# Patient Record
Sex: Male | Born: 1935 | Race: White | Hispanic: No | Marital: Single | Smoking: Never smoker
Health system: Southern US, Community
[De-identification: ages and names within clinical notes are randomized; demographics above are authoritative.]

## PROBLEM LIST (undated history)

## (undated) DIAGNOSIS — I10 Essential (primary) hypertension: Secondary | ICD-10-CM

## (undated) DIAGNOSIS — K5730 Diverticulosis of large intestine without perforation or abscess without bleeding: Secondary | ICD-10-CM

## (undated) DIAGNOSIS — Z923 Personal history of irradiation: Secondary | ICD-10-CM

## (undated) DIAGNOSIS — Z973 Presence of spectacles and contact lenses: Secondary | ICD-10-CM

## (undated) DIAGNOSIS — Z9889 Other specified postprocedural states: Secondary | ICD-10-CM

## (undated) DIAGNOSIS — M1990 Unspecified osteoarthritis, unspecified site: Secondary | ICD-10-CM

## (undated) DIAGNOSIS — N400 Enlarged prostate without lower urinary tract symptoms: Secondary | ICD-10-CM

## (undated) DIAGNOSIS — G14 Postpolio syndrome: Secondary | ICD-10-CM

## (undated) DIAGNOSIS — N529 Male erectile dysfunction, unspecified: Secondary | ICD-10-CM

## (undated) DIAGNOSIS — Z85828 Personal history of other malignant neoplasm of skin: Secondary | ICD-10-CM

## (undated) DIAGNOSIS — Z87442 Personal history of urinary calculi: Secondary | ICD-10-CM

## (undated) DIAGNOSIS — N200 Calculus of kidney: Secondary | ICD-10-CM

## (undated) DIAGNOSIS — Z974 Presence of external hearing-aid: Secondary | ICD-10-CM

## (undated) DIAGNOSIS — E785 Hyperlipidemia, unspecified: Secondary | ICD-10-CM

## (undated) DIAGNOSIS — C61 Malignant neoplasm of prostate: Secondary | ICD-10-CM

## (undated) DIAGNOSIS — N401 Enlarged prostate with lower urinary tract symptoms: Secondary | ICD-10-CM

## (undated) DIAGNOSIS — R29898 Other symptoms and signs involving the musculoskeletal system: Secondary | ICD-10-CM

## (undated) DIAGNOSIS — Z86010 Personal history of colonic polyps: Secondary | ICD-10-CM

## (undated) DIAGNOSIS — A809 Acute poliomyelitis, unspecified: Secondary | ICD-10-CM

## (undated) DIAGNOSIS — Z860101 Personal history of adenomatous and serrated colon polyps: Secondary | ICD-10-CM

## (undated) HISTORY — DX: Acute poliomyelitis, unspecified: A80.9

## (undated) HISTORY — DX: Malignant neoplasm of prostate: C61

## (undated) HISTORY — DX: Other specified postprocedural states: Z98.890

## (undated) HISTORY — DX: Essential (primary) hypertension: I10

## (undated) HISTORY — DX: Calculus of kidney: N20.0

## (undated) HISTORY — DX: Benign prostatic hyperplasia with lower urinary tract symptoms: N40.1

## (undated) HISTORY — PX: APPENDECTOMY: SHX54

## (undated) HISTORY — DX: Personal history of colonic polyps: Z86.010

---
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
## 1989-07-09 DIAGNOSIS — N200 Calculus of kidney: Secondary | ICD-10-CM

## 1989-07-09 HISTORY — DX: Calculus of kidney: N20.0

## 1990-11-08 HISTORY — PX: CYSTOSCOPY/RETROGRADE/URETEROSCOPY/STONE EXTRACTION WITH BASKET: SHX5317

## 2008-11-08 HISTORY — PX: FEMUR FRACTURE SURGERY: SHX633

## 2009-03-11 ENCOUNTER — Inpatient Hospital Stay (HOSPITAL_COMMUNITY): Admission: EM | Admit: 2009-03-11 | Discharge: 2009-03-15 | Payer: Self-pay | Admitting: Emergency Medicine

## 2009-03-11 HISTORY — PX: ORIF DISTAL FEMUR FRACTURE: SUR926

## 2009-08-13 ENCOUNTER — Encounter: Admission: RE | Admit: 2009-08-13 | Discharge: 2009-08-20 | Payer: Self-pay | Admitting: Internal Medicine

## 2010-06-08 DIAGNOSIS — Z8546 Personal history of malignant neoplasm of prostate: Secondary | ICD-10-CM

## 2010-06-08 HISTORY — DX: Personal history of malignant neoplasm of prostate: Z85.46

## 2010-06-10 ENCOUNTER — Ambulatory Visit (HOSPITAL_COMMUNITY): Admission: RE | Admit: 2010-06-10 | Discharge: 2010-06-10 | Payer: Self-pay | Admitting: Urology

## 2010-06-16 ENCOUNTER — Ambulatory Visit: Admission: RE | Admit: 2010-06-16 | Discharge: 2010-07-29 | Payer: Self-pay | Admitting: Radiation Oncology

## 2010-09-07 ENCOUNTER — Ambulatory Visit
Admission: RE | Admit: 2010-09-07 | Discharge: 2010-10-21 | Payer: Self-pay | Source: Home / Self Care | Attending: Radiation Oncology | Admitting: Radiation Oncology

## 2010-11-05 ENCOUNTER — Encounter
Admission: RE | Admit: 2010-11-05 | Discharge: 2010-11-05 | Payer: Self-pay | Source: Home / Self Care | Attending: Urology | Admitting: Urology

## 2010-11-12 ENCOUNTER — Ambulatory Visit
Admission: RE | Admit: 2010-11-12 | Discharge: 2010-11-12 | Payer: Self-pay | Source: Home / Self Care | Attending: Urology | Admitting: Urology

## 2010-11-12 HISTORY — PX: RADIOACTIVE SEED IMPLANT: SHX5150

## 2010-12-03 ENCOUNTER — Ambulatory Visit
Admission: RE | Admit: 2010-12-03 | Discharge: 2010-12-08 | Payer: Self-pay | Source: Home / Self Care | Attending: Radiation Oncology | Admitting: Radiation Oncology

## 2010-12-21 ENCOUNTER — Ambulatory Visit: Payer: MEDICARE | Attending: Radiation Oncology | Admitting: Radiation Oncology

## 2010-12-21 DIAGNOSIS — C61 Malignant neoplasm of prostate: Secondary | ICD-10-CM | POA: Insufficient documentation

## 2011-01-18 LAB — CBC
HCT: 39.4 % (ref 39.0–52.0)
Hemoglobin: 12.8 g/dL — ABNORMAL LOW (ref 13.0–17.0)
MCH: 31.1 pg (ref 26.0–34.0)
MCHC: 32.5 g/dL (ref 30.0–36.0)
MCV: 95.6 fL (ref 78.0–100.0)
RBC: 4.12 MIL/uL — ABNORMAL LOW (ref 4.22–5.81)
WBC: 5.6 10*3/uL (ref 4.0–10.5)

## 2011-01-18 LAB — COMPREHENSIVE METABOLIC PANEL
ALT: 25 U/L (ref 0–53)
AST: 26 U/L (ref 0–37)
Albumin: 3.6 g/dL (ref 3.5–5.2)
Alkaline Phosphatase: 83 U/L (ref 39–117)
Glucose, Bld: 117 mg/dL — ABNORMAL HIGH (ref 70–99)
Total Bilirubin: 0.7 mg/dL (ref 0.3–1.2)
Total Protein: 6.4 g/dL (ref 6.0–8.3)

## 2011-01-18 LAB — PROTIME-INR
INR: 0.96 (ref 0.00–1.49)
Prothrombin Time: 13 seconds (ref 11.6–15.2)

## 2011-02-16 LAB — ABO/RH: ABO/RH(D): O POS

## 2011-02-16 LAB — CBC
HCT: 41 % (ref 39.0–52.0)
Hemoglobin: 11 g/dL — ABNORMAL LOW (ref 13.0–17.0)
Hemoglobin: 8.8 g/dL — ABNORMAL LOW (ref 13.0–17.0)
MCHC: 34.3 g/dL (ref 30.0–36.0)
MCHC: 34.4 g/dL (ref 30.0–36.0)
Platelets: 143 10*3/uL — ABNORMAL LOW (ref 150–400)
RBC: 2.78 MIL/uL — ABNORMAL LOW (ref 4.22–5.81)
RBC: 3.51 MIL/uL — ABNORMAL LOW (ref 4.22–5.81)
RBC: 4.5 MIL/uL (ref 4.22–5.81)
RDW: 13.9 % (ref 11.5–15.5)
RDW: 14 % (ref 11.5–15.5)
WBC: 8.2 10*3/uL (ref 4.0–10.5)

## 2011-02-16 LAB — BASIC METABOLIC PANEL
BUN: 12 mg/dL (ref 6–23)
CO2: 27 mEq/L (ref 19–32)
Chloride: 107 mEq/L (ref 96–112)
Creatinine, Ser: 0.82 mg/dL (ref 0.4–1.5)
Creatinine, Ser: 1.1 mg/dL (ref 0.4–1.5)
GFR calc non Af Amer: >60 mL/min (ref >60)
GFR calc non Af Amer: >60 mL/min (ref >60)
Glucose, Bld: 116 mg/dL — ABNORMAL HIGH (ref 70–99)
Potassium: 3.5 mEq/L (ref 3.5–5.1)
Potassium: 4.5 mEq/L (ref 3.5–5.1)

## 2011-02-16 LAB — POCT I-STAT, CHEM 8
BUN: 19 mg/dL (ref 6–23)
Chloride: 106 mEq/L (ref 96–112)
Glucose, Bld: 112 mg/dL — ABNORMAL HIGH (ref 70–99)
HCT: 43 % (ref 39.0–52.0)
Sodium: 141 mEq/L (ref 135–145)

## 2011-02-16 LAB — TYPE AND SCREEN: ABO/RH(D): O POS

## 2011-02-16 LAB — HEMOGLOBIN AND HEMATOCRIT, BLOOD
HCT: 24.3 % — ABNORMAL LOW (ref 39.0–52.0)
HCT: 29 % — ABNORMAL LOW (ref 39.0–52.0)

## 2011-02-16 LAB — DIFFERENTIAL
Basophils Absolute: 0 10*3/uL (ref 0.0–0.1)
Eosinophils Relative: 0 % (ref 0–5)
Lymphocytes Relative: 6 % — ABNORMAL LOW (ref 12–46)
Lymphs Abs: 0.9 10*3/uL (ref 0.7–4.0)
Neutro Abs: 12.9 10*3/uL — ABNORMAL HIGH (ref 1.7–7.7)

## 2011-03-23 NOTE — Discharge Summary (Signed)
NAMEGERRITT, GALENTINE NO.:  [id number]   MEDICAL RECORD NO.:  [id number]          PATIENT TYPE:  INP   LOCATION:  1612                         FACILITY:  [facility]   PHYSICIAN:  Madlyn Frankel. Charlann Boxer, M.D.  DATE OF BIRTH:  [birth year]   DATE OF ADMISSION:  03/10/2009  DATE OF DISCHARGE:                               DISCHARGE SUMMARY   DATE OF DISCHARGE:  Will be Mar 15, 2009.   ADMITTING DIAGNOSES:  1. Right femur fracture.  2. Post polio.  3. Hypertension.   DISCHARGE DIAGNOSES:  1. Right femur fracture.  2. Post polio.  3. Hypertension.  4. Acute blood loss anemia.   HISTORY OF PRESENT ILLNESS:  Mr. Oddo is a very pleasant, healthy 77-  year-old gentleman who fell at his kitchen at home, twisting, falling on  his right side.  X-rays at [facility] Emergency Department revealed a  right mid shaft humeral fracture spiral in nature.  He was admitted in  the hospital for definitive surgical management.  Prior to this injury  he did have muscle atrophy of this right side as well as some mild  contracture of this right lower extremity.   CONSULTS:  None.   PROCEDURE:  Was an open reduction internal fixation of his right distal  femur with spiral fracture with nailing as well as cable use.  Surgeon  was Dr. Durene Romans, assistant was [facility] PA-C.   LABORATORY DATA:  CBC:  Hemoglobin/hematocrit checked.  His final  reading white blood cell count was 8.2.  Hemoglobin/hematocrit checked  on the 7th was 8.3 and 24.3.  His platelets were 143.  Metabolic panel:  Sodium 139, potassium 3.6, BUN 12, creatinine was 1.1 with increase from  prior reading, and glucose was 116.   RADIOLOGY:  Right femur intraoperatively showed intramedullary rod right  femur with distal interlocking cortical screw with 3 cerclage wires.   Cardiology showed normal sinus rhythm.   HOSPITAL COURSE:  The patient admitted to orthopedic service.  He  underwent surgery on the 4th.  During his  course of stay in the  hospital, he did have some acute blood loss anemia.  Was transfused 2  units on the 7th.  He was touchdown weightbearing to this right lower  extremity with knee immobilizer, and predominantly had physical therapy  as well as occupational therapy to help with transfers as well as  encouragement with independence with activities of daily living.  He was  agreeable to skilled nursing facility placement, and this was  coordinated through case management and family.  [facility] was  selected.  When seen on the 7th he was receiving a transfusion with plan  of recheck of H and H in the morning and discharge to skilled nursing  facility on the 8th.  He had several incisions on the lateral aspect of  his right lower extremity which were closed with staples, and covered  with dry gauze dressing with no significant purulent drainage from any  wound site.  There was some mild bleeding.  He does have some swelling  of that right knee and movement  was painful.   DISCHARGE DISPOSITION:  Discharged to skilled nursing facility  rehabilitation, stable and improved condition.   DISCHARGE DIET:  Heart-healthy.   DISCHARGE WOUND CARE:  Keep dry.   DISCHARGE PHYSICAL THERAPY:  He is touchdown weightbearing of this right  lower extremity 25-50% predominantly for transfers.  Goals of physical  therapy will be to maintain upper body strength, allow for transfers,  encourage independence in activities of daily living.  Prior to injury  he did have muscle atrophy and weakness with contracture of this right  lower extremity in the past.   DISCHARGE MEDICATIONS:  1. Lovenox 40 mg subcu q.24 x10 days.  2. Start enteric-coated aspirin 325 mg 1 p.o. daily x4 weeks after      Lovenox is completed.  3. Robaxin 500 mg 1 p.o. q.6 p.r.n. muscle spasm pain.  4. Norco 7.5/325 one to two p.o. q.4-6 p.r.n. pain.  5. Ativan 0.5 mg 1 p.o. q.8 p.r.n. anxiety, pain.  6. Colace 100 mg 1 p.o. b.i.d.  p.r.n. constipation.  7. MiraLax 17 g 1 p.o. daily p.r.n. constipation.  8. Tylenol 325-650 mg p.o. q.4-6 p.r.n. mild pain in substitution      rather than administering Norco preferred by patient.  9. Hyzaar 100/25 one p.o. daily.  10.Multivitamin p.o. daily.  11.Omega-3 1000 mg 1 with each meal 3 times a day.  12.Vitamin B12.  He takes two 2500 mcg daily.   DISCHARGE FOLLOWUP:  Follow up with Dr. Charlann Boxer at phone number [phone] in  2 weeks for a wound check.     ______________________________  Yetta Glassman. [address], [place]      Madlyn Frankel. Charlann Boxer, M.D.  Electronically Signed    BLM/MEDQ  D:  03/14/2009  T:  03/14/2009  Job:  [id number]

## 2011-03-23 NOTE — Op Note (Signed)
NAMESILVINO, SELMAN NO.:  [id number]   MEDICAL RECORD NO.:  [id number]          PATIENT TYPE:  INP   LOCATION:  1612                         FACILITY:  [facility]   PHYSICIAN:  Madlyn Frankel. Charlann Boxer, M.D.  DATE OF BIRTH:  28-Sep-1932   DATE OF PROCEDURE:  03/11/2009  DATE OF DISCHARGE:                               OPERATIVE REPORT   PREOPERATIVE DIAGNOSIS:  Distal one-third right spiral femur fracture.   POSTOPERATIVE DIAGNOSIS:  Distal one-third right spiral femur fracture.   PROCEDURE:  Open reduction and internal fixation of right distal femur  fracture utilizing a DePuy universal troch entry nail measuring 42 cm x  13 mm in diameter with a single greater troch, the lesser troch screw  proximally, and a single distal interlock distally in 3 cables around  the spiral comminuted butterfly fragment section.   These were by Thallium Dall-Miles cables.   SURGEON:  Madlyn Frankel. Charlann Boxer, MD.   ASSISTANTYetta Glassman. Loreta Ave, PA-C.   ANESTHESIA:  General.   BLOOD LOSS:  600 mL.   COMPLICATIONS:  None.   SPECIMEN:  None.   INDICATIONS FOR PROCEDURE IN DETAIL:  This is a 77 year old male, who  was in his kitchen cooking dinner when he slipped and fell.  He was  brought by EMS to the emergency room where radiographs revealed a distal  one-third spiral femur fracture.  He was admitted to the hospital in the  late evening of Mar 10, 2009, into Mar 11, 2009, with plans to operate  that day.  The risks and benefits of the operation were discussed,  consent was obtained, for benefit of fracture healing and improved  mobility.   PROCEDURE IN DETAIL:  The patient was brought to the operative theater.  Once adequate anesthesia, preoperative antibiotics, Ancef administered,  the patient was positioned on the fracture table.  His left leg was  flexed and abducted out of the way with bony prominences padded.  The  right foot was placed into the traction shoe.  Under fluoroscopic  imaging, I applied enough traction to get the fracture out to a normal  length.  Once the length was restored, we prepped and draped the hip  from the iliac crest down to below the knee.   A preoperative timeout was performed identifying the patient, planned  procedure, and the extremity as well as other factors.   Fluoroscopic imaging was brought back to the field to identify  landmarks, and a proximal incision was made to the trochanter.  Sharp  dissection was carried through the gluteal fascia, and the guidewire  inserted into the tip of the trochanter.   A starting reamer was used to open up the proximal femur followed by the  placement of a cannulated awl to pass the ball-tip guidewire.  The ball-  tip guidewire was then passed across the fracture site and confirmed in  the AP and lateral plane with fracture maneuvers identified that would  help to reduce the fracture including basically a load from the  posterolateral area of the fracture site directed into an anteromedial  direction.  This was noted from the later reduction.  With the guidewire, I  subsequently began reaming with a 10-mm reamer and reamed all the way up  to 14.5 to pass a 13-mm nail.  I measured the depth of the nail to be 42  cm.   When I began to pass the final nail, I ended up removing the nail after  trying to get it about a third of the way and change it out and reamed  up to 15 mm to prevent any complication.   The nail was then passed by hand basically across the fracture site and  then tapped into its correct orientation with enlarged anteversion with  the femoral neck and shaft.  The fracture remained reduced nicely in the  AP plane.  In the lateral plane, there was still displacement of the  butterfly fragment into spiral sections.  In addition, what I did find  is that it still required a significant portion of this posterolateral  aspect in order to maintain the reduced fracture.   At this  point, I placed a greater troch to lesser troch screw.  I did  this based on the fact that he tended to have a valgus femoral neck  shaft angle making it difficult to get a screw into the correct  orientation with the nail and the correct placement for the distal  portion.  This screw was placed without difficulty.   Distally under perfect circle technique, I placed a single; the system  did not have a second screw available that measured greater than 85 mm  to pass into the most distal metaphyseal region of the femur for a  second bite that would be necessary.   Here, I had to make a decision on whether or not to close up the hip at  this point in the femur and proceed with a procedure at a later date or  to come up with an alternative.  I was not happy with the deforming  forces that were still around the fracture site, even on the fracture  table with some tension, he still had a significant varus load through  his fracture site.  I had to hold it into a correct orientation to keep  the fracture reduced.  Based on these findings, I made a decision at  this point to open up at the fracture site.  Sharp dissection was  carried down through the iliotibial band and then I elevated the vastus  lateralis musculature over the fracture site.  With the  fracture site  identified, I placed the 3 Vitallium cables under fluoroscopic guidance.  What this did was it  further reduced the butterfly fragment and spiral  segments around the intramedullary nail into an anatomic reduction.  No  longer was there any force required to maintain a reduced fracture.  I  think this did 2 things.  Number 1, it reduced the fracture into an  anatomic position allowing for better healing with a more rigid  fixation, but it subsequently took the tension off this distal  interlock.  At this point, I felt secure at least not having a second  interlock and I did not feel that I would need to bring him back in the   operating room provided I was able to keep him restricted in his  weightbearing.  Following this, the decision-making process and the  conclusion of the procedure, the wounds were reirrigated both proximally  and distally.  The gluteal  fascia proximally was reapproximated using #1  Vicryl, the iliotibial band was reapproximated to the vastus lateralis  using #1 Vicryl.  All the wounds were then subsequently closed with 2-0  Vicryl and staples on the skin.   Following this, the hip was clean, dry, and dressed sterilely.  I placed  him into a knee immobilizer on the right knee to further help reduce  some of the tension on this knee and distal femur.  He was brought to  the recovery room extubated in stable condition.      Madlyn Frankel Charlann Boxer, M.D.  Electronically Signed     MDO/MEDQ  D:  03/11/2009  T:  03/11/2009  Job:  [id number]

## 2011-03-23 NOTE — H&P (Signed)
NAMEETHEN, BANNAN NO.:  [id number]   MEDICAL RECORD NO.:  [id number]          PATIENT TYPE:  INP   LOCATION:                               FACILITY:  [facility]   PHYSICIAN:  Madlyn Frankel. Charlann Boxer, M.D.  DATE OF BIRTH:  1932-05-15   DATE OF ADMISSION:  03/10/2009  DATE OF DISCHARGE:                              HISTORY & PHYSICAL   CHIEF COMPLAINT:  Pain in the right thigh.   PRESENT ILLNESS:  This 77 year old white male was at his home cooking in  his kitchen when he spilled some grease on to the floor.  He slipped on  this grease and fell on his right side.  His leg somewhat twisted as he  fell.  He has a history of polio, which he contracted in 18 in [place]  and has done fairly well throughout the years but he does have weakness  in the lower extremities, more so on the right than the left.  He had  immediate pain into the right thigh.  Ambulance was called and he was  brought to [facility] Emergency Room.  He did not suffer any other  injuries, just his right lower extremity.  X-rays have shown a spiral  minimally displaced midshaft fracture of the right femur.  Dr. Charlann Boxer  plans to do an intramedullary nail when operating room schedule permits  tomorrow.   Examination of the right lower extremity reveals some swelling about the  thigh.  He does have wasting of both lower extremities.  Sensory is  grossly intact to the right foot and pulses are present but faint on the  dorsalis pedis.  He can move his ankle without difficulty.   PAST MEDICAL HISTORY:  This gentleman is in relatively good health  throughout his lifetime.   MEDICATIONS:  Aspirin 81 mg daily and Hyzaar.   SOCIAL HISTORY:  The patient has an occasional intake of EtOH and no  tobacco products.   ALLERGIES:  He has no medical allergies.   FAMILY HISTORY:  Noncontributory.   REVIEW OF SYSTEMS:  CNS:  No seizure disorder, paralysis, numbness or  double vision, but he does have wasting of lower  extremities, again  secondary to polio.  CARDIOVASCULAR:  No chest pain, no angina, and no orthopnea.  RESPIRATORY:  No productive cough.  No hemoptysis.  No shortness of  breath.  GASTROINTESTINAL:  No nausea, vomiting, melena, or bloody stool.  GENITOURINARY:  No discharge, dysuria, or hematuria.  MUSCULOSKELETAL:  Other than present illness with pain in the right  thigh, he has fairly good ambulation and occasionally will use a cane.   PHYSICAL EXAMINATION:  GENERAL:  Alert and cooperative 77 year old white  male seen lying on emergency room stretcher.  He is can accompanied by  his son, Lorin Picket.  VITAL SIGNS:  Blood pressure 134/73, pulse 82, respirations 18, and  temperature 97.5.  HEENT: Normocephalic.  PERRLA.  EOM Intact.  Oropharynx is clear.  CHEST:  Clear to auscultation.  No rhonchi, no rales, and no wheezes  with the patient auscultated supine.  HEART:  Regular rate and rhythm.  No murmurs are heard.  ABDOMEN:  Soft and nontender.  Liver and spleen not felt.  GENITALIA and RECTAL:  Not done, not pertinent to present illness.  EXTREMITIES:  Right lower extremity as in present illness above.   ADMISSION DIAGNOSES:  1. Spiral fracture of the midshaft of the right femur.  2. Hypertension.   PLAN:  The patient will undergo intramedullary nailing of the right  femur tomorrow when operating room schedule permits.  Dr. Charlann Boxer was in  attendance during this patient's examination and admission.      Dooley L. Cherlynn June.      Madlyn Frankel Charlann Boxer, M.D.  Electronically Signed    DLU/MEDQ  D:  03/10/2009  T:  03/11/2009  Job:  [id number]   cc:   Madlyn Frankel. Charlann Boxer, M.D.  Fax: [phone]

## 2011-09-10 IMAGING — CR DG PELVIS 1-2V
1 series · 1 of 1 positions shown · non-contrast
Comparison: 03/10/2009.

CLINICAL DATA: Prostate cancer.

PELVIS - 1-2 VIEW

[t pelvis a.p.]
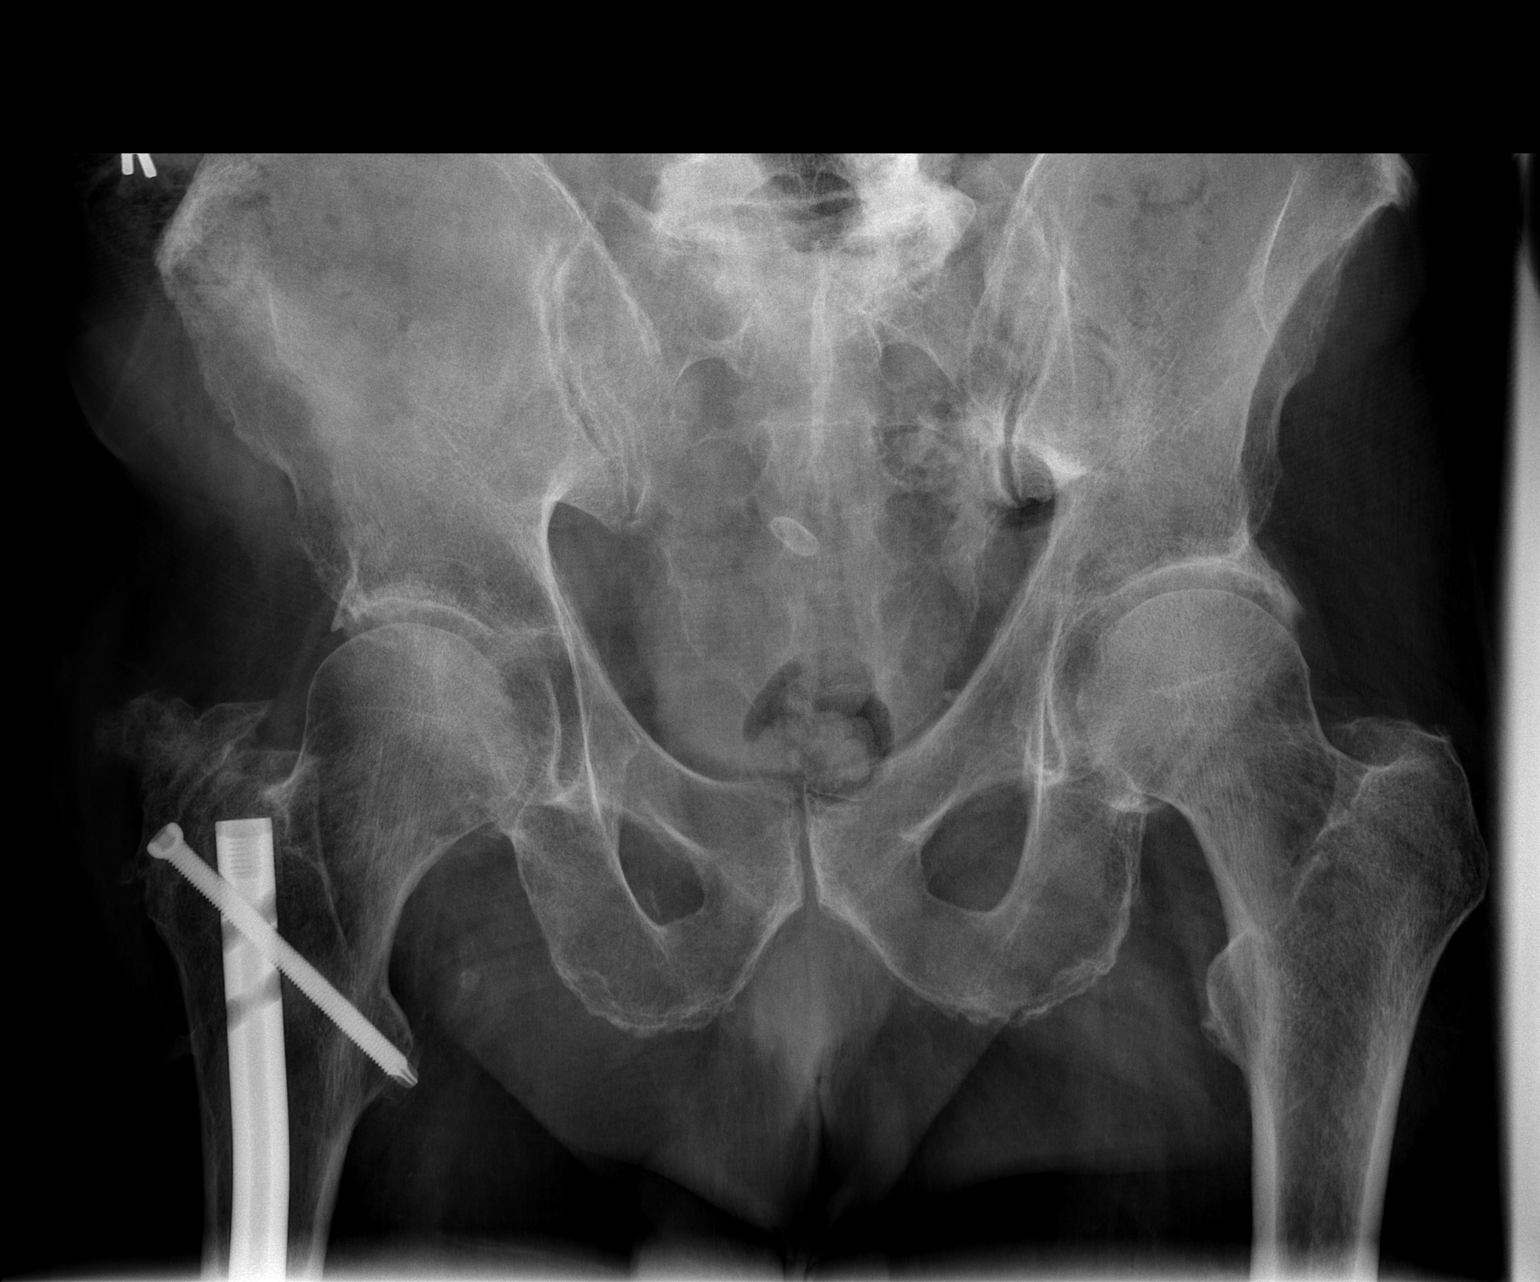

[1 of 1 positions shown; findings below may reference images not displayed]

FINDINGS: There is a intermedullary rod in the right femur.  Both
hips are normally located.  No lytic or blastic lesions are seen.
The pubic symphysis SI joints are intact.  There are mild symmetric
hip joint generative changes bilaterally.
IMPRESSION: No plain film findings for bony metastatic disease involving the
pelvis.

## 2011-10-19 ENCOUNTER — Encounter: Payer: Self-pay | Admitting: *Deleted

## 2011-10-19 NOTE — Progress Notes (Signed)
06/17/10 IPPIS  Results= 0010000 score=1, 06/17/10 IIEF results= [id number] 12/04/2010 IPPIS results= [id number]

## 2012-02-05 IMAGING — CR DG CHEST 2V
2 series · 2 of 2 positions shown · non-contrast
Comparison: 03/10/2009.

CLINICAL DATA: Prostate cancer.  Preoperative chest x-ray.

CHEST - 2 VIEW

[w chest pa]
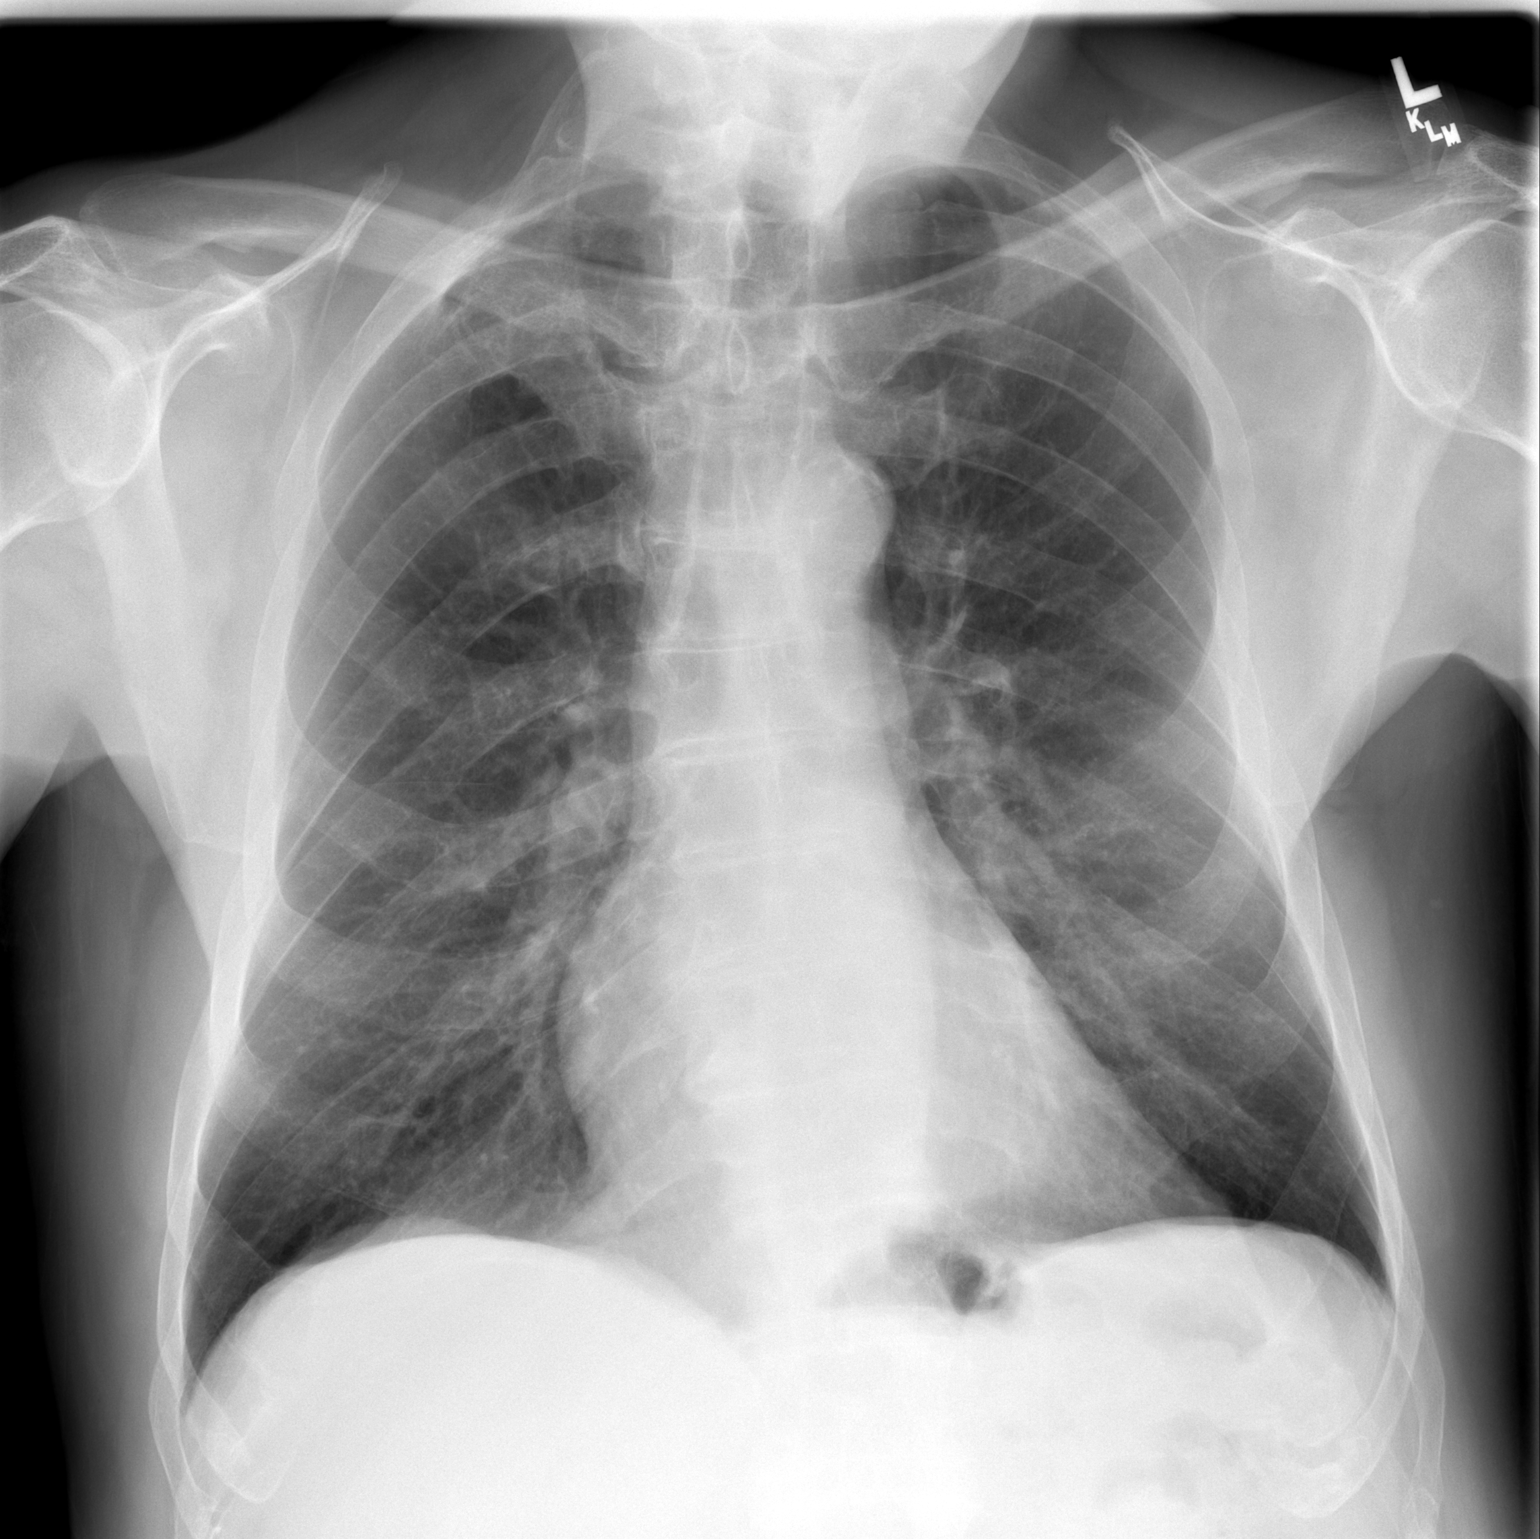

[w chest lat]
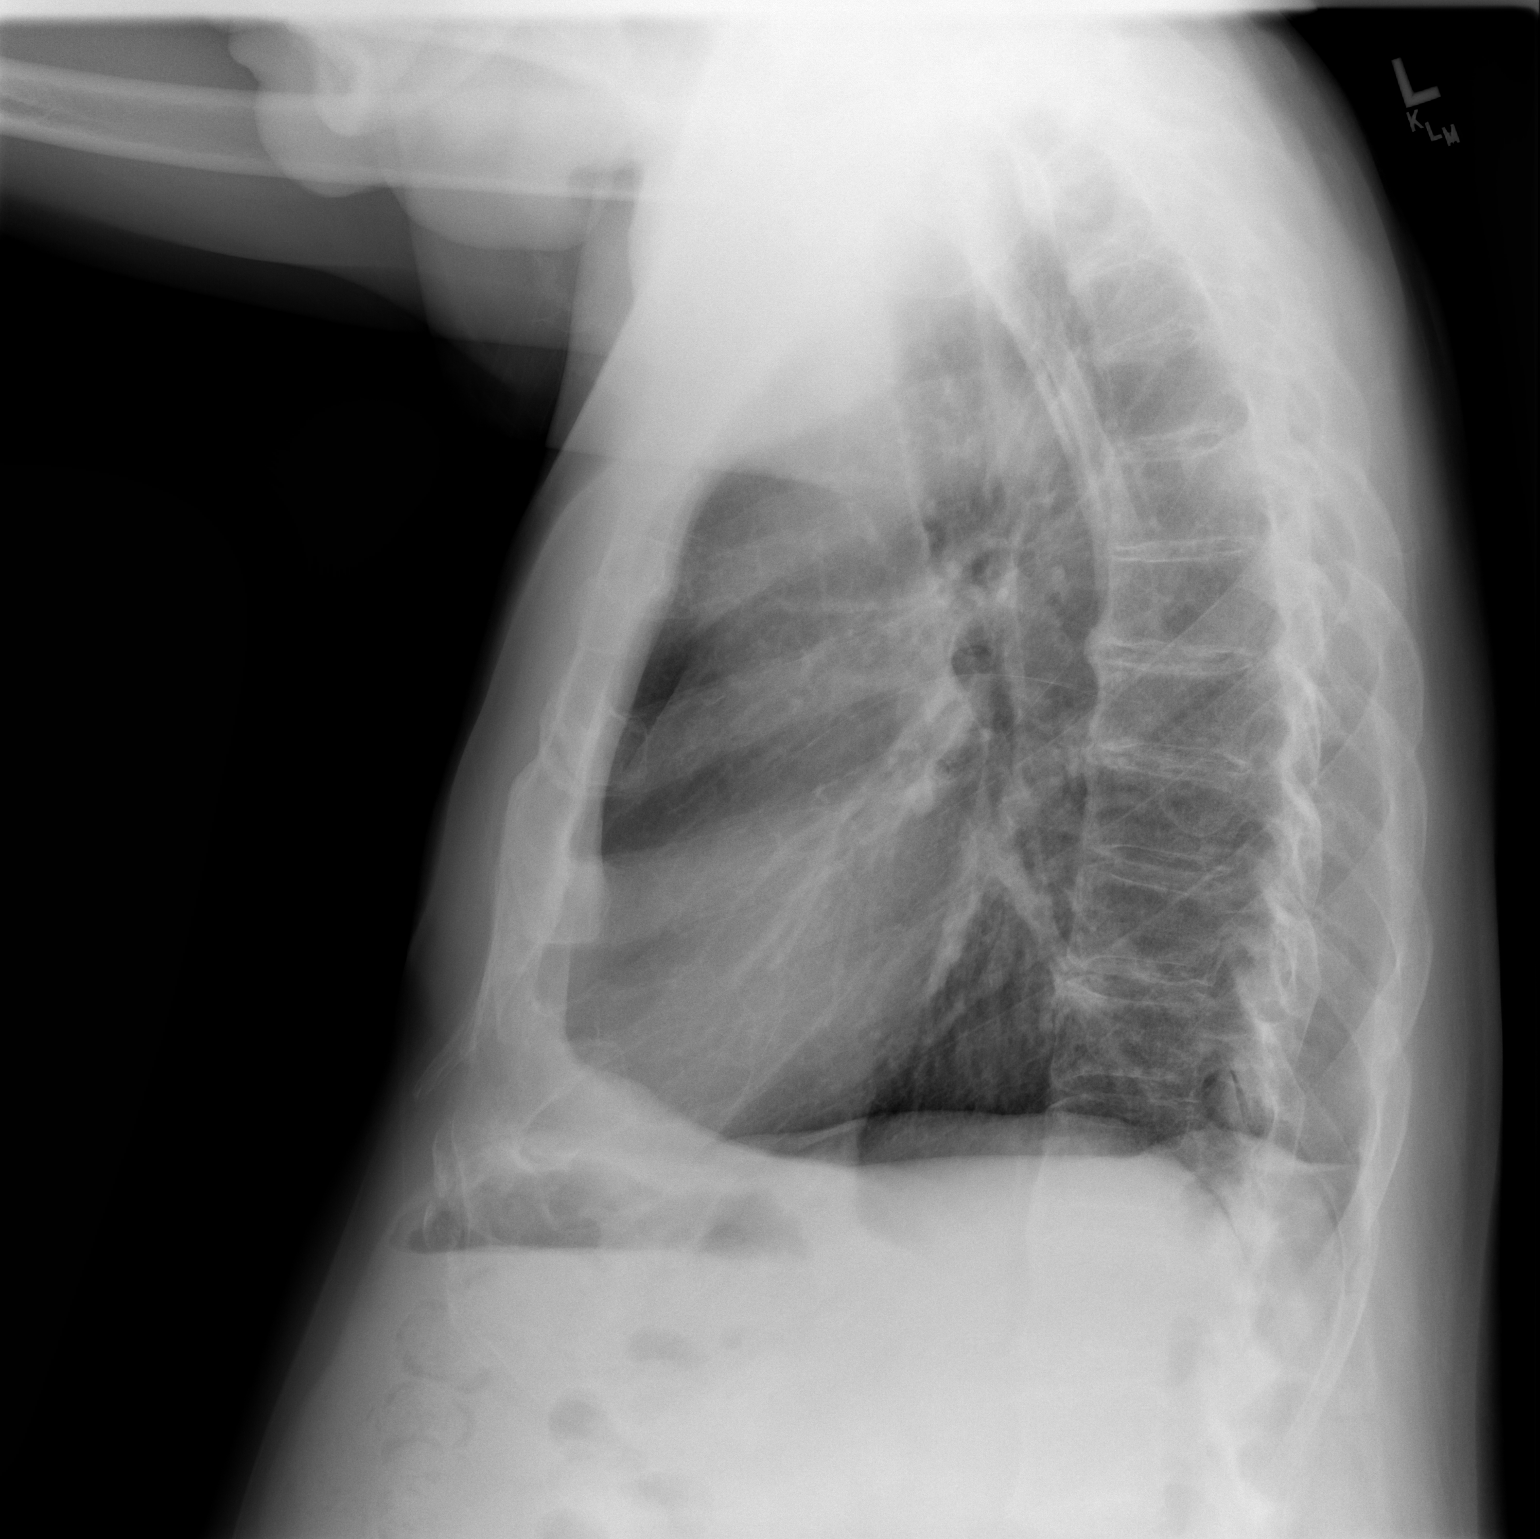

[2 of 2 positions shown; findings below may reference images not displayed]

FINDINGS: The lungs are clear.  The heart and mediastinal
structures are normal.  There is mild hyperinflation.
IMPRESSION: Mild hyperinflation.  No evidence for active chest disease.

## 2013-04-12 ENCOUNTER — Encounter: Payer: Self-pay | Admitting: Internal Medicine

## 2013-05-24 ENCOUNTER — Ambulatory Visit (AMBULATORY_SURGERY_CENTER): Payer: Medicare Other | Admitting: *Deleted

## 2013-05-24 ENCOUNTER — Encounter: Payer: Self-pay | Admitting: Internal Medicine

## 2013-05-24 VITALS — Ht 72.0 in | Wt 207.0 lb

## 2013-05-24 DIAGNOSIS — K921 Melena: Secondary | ICD-10-CM

## 2013-05-24 MED ORDER — MOVIPREP 100 G PO SOLR: 1.0000 | Freq: Once | ORAL | Status: DC

## 2013-05-24 NOTE — Progress Notes (Signed)
Denies allergies to eggs or soy products. Denies complications with sedation or anesthesia. 

## 2013-06-07 ENCOUNTER — Encounter: Payer: Self-pay | Admitting: Internal Medicine

## 2013-06-07 ENCOUNTER — Ambulatory Visit (AMBULATORY_SURGERY_CENTER): Payer: Medicare Other | Admitting: Internal Medicine

## 2013-06-07 VITALS — BP 143/90 | HR 71 | Temp 96.5°F | Resp 62 | Ht 72.0 in | Wt 207.0 lb

## 2013-06-07 DIAGNOSIS — D126 Benign neoplasm of colon, unspecified: Secondary | ICD-10-CM

## 2013-06-07 DIAGNOSIS — K627 Radiation proctitis: Secondary | ICD-10-CM

## 2013-06-07 DIAGNOSIS — K921 Melena: Secondary | ICD-10-CM

## 2013-06-07 DIAGNOSIS — K6289 Other specified diseases of anus and rectum: Secondary | ICD-10-CM

## 2013-06-07 HISTORY — PX: COLONOSCOPY WITH PROPOFOL: SHX5780

## 2013-06-07 MED ORDER — SODIUM CHLORIDE 0.9 % IV SOLN: 500.0000 mL | INTRAVENOUS | Status: DC

## 2013-06-07 NOTE — Progress Notes (Signed)
No complaints noted in the recovery room. Maw  I assisted the pt with dressing, and sent his daughter to get the car.  Pt's daughter has his glasses.  maw

## 2013-06-07 NOTE — Patient Instructions (Addendum)
YOU HAD AN ENDOSCOPIC PROCEDURE TODAY AT THE [facility]: Refer to the procedure report that was given to you for any specific questions about what was found during the examination.  If the procedure report does not answer your questions, please call your gastroenterologist to clarify.  If you requested that your care partner not be given the details of your procedure findings, then the procedure report has been included in a sealed envelope for you to review at your convenience later.  YOU SHOULD EXPECT: Some feelings of bloating in the abdomen. Passage of more gas than usual.  Walking can help get rid of the air that was put into your GI tract during the procedure and reduce the bloating. If you had a lower endoscopy (such as a colonoscopy or flexible sigmoidoscopy) you may notice spotting of blood in your stool or on the toilet paper. If you underwent a bowel prep for your procedure, then you may not have a normal bowel movement for a few days.  DIET: Your first meal following the procedure should be a light meal and then it is ok to progress to your normal diet.  A half-sandwich or bowl of soup is an example of a good first meal.  Heavy or fried foods are harder to digest and may make you feel nauseous or bloated.  Likewise meals heavy in dairy and vegetables can cause extra gas to form and this can also increase the bloating.  Drink plenty of fluids but you should avoid alcoholic beverages for 24 hours.  ACTIVITY: Your care partner should take you home directly after the procedure.  You should plan to take it easy, moving slowly for the rest of the day.  You can resume normal activity the day after the procedure however you should NOT DRIVE or use heavy machinery for 24 hours (because of the sedation medicines used during the test).    SYMPTOMS TO REPORT IMMEDIATELY: A gastroenterologist can be reached at any hour.  During normal business hours, 8:30 AM to 5:00 PM Monday through Friday,  call [phone].  After hours and on weekends, please call the GI answering service at [phone] who will take a message and have the physician on call contact you.   Following lower endoscopy (colonoscopy or flexible sigmoidoscopy):  Excessive amounts of blood in the stool  Significant tenderness or worsening of abdominal pains  Swelling of the abdomen that is new, acute  Fever of 100F or higher  FOLLOW UP: If any biopsies were taken you will be contacted by phone or by letter within the next 1-3 weeks.  Call your gastroenterologist if you have not heard about the biopsies in 3 weeks.  Our staff will call the home number listed on your records the next business day following your procedure to check on you and address any questions or concerns that you may have at that time regarding the information given to you following your procedure. This is a courtesy call and so if there is no answer at the home number and we have not heard from you through the emergency physician on call, we will assume that you have returned to your regular daily activities without incident.  SIGNATURES/CONFIDENTIALITY: You and/or your care partner have signed paperwork which will be entered into your electronic medical record.  These signatures attest to the fact that that the information above on your After Visit Summary has been reviewed and is understood.  Full responsibility of the confidentiality of this   discharge information lies with you and/or your care-partner.   Handouts were given to your care partner on polyps, diverticulosis, high fiber diet and proctitis. Resume your current medications today. Per Dr. Marina Goodell he does not recommend routine hemoccult studies in the future or follow up colonoscopy. Return to the care of your primary provider. Please call if any questions or concerns.

## 2013-06-07 NOTE — Op Note (Signed)
[facility] [address] [place], [address]   COLONOSCOPY PROCEDURE REPORT  PATIENT: Nathaniel, Watson  MR#: [id number] BIRTHDATE: 11-13-1931 , 80  yrs. old GENDER: Male ENDOSCOPIST: Roxy Cedar, MD REFERRED XB:[id number] Tisovec, M.D. PROCEDURE DATE:  06/07/2013 PROCEDURE:   Colonoscopy with snare polypectomy x 1 First Screening Colonoscopy - Avg.  risk and is 50 yrs.  old or older - No.  Prior Negative Screening - Now for repeat screening. N/A  History of Adenoma - Now for follow-up colonoscopy & has been > or = to 3 yrs.  N/A  Polyps Removed Today? Yes. ASA CLASS:   Class II INDICATIONS:heme-positive stool. MEDICATIONS: MAC sedation, administered by CRNA and propofol (Diprivan) 250mg  IV  DESCRIPTION OF PROCEDURE:   After the risks benefits and alternatives of the procedure were thoroughly explained, informed consent was obtained.  A digital rectal exam revealed no abnormalities of the rectum.   The LB [id number]  endoscope was introduced through the anus and advanced to the cecum, which was identified by both the appendix and ileocecal valve. No adverse events experienced.   The quality of the prep was excellent, using MoviPrep  The instrument was then slowly withdrawn as the colon was fully examined.  COLON FINDINGS: A diminutive polyp was found in the ascending colon. A polypectomy was performed with a cold snare.  The resection was complete and the polyp tissue was completely retrieved.   Moderate diverticulosis was noted  in the right  and  left colon.   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   The colon mucosa was otherwise normal.  Retroflexed views revealed no abnormalities. The time to cecum=3 minutes 33 seconds.  Withdrawal time=12 minutes 01 seconds. The scope was withdrawn and the procedure completed. COMPLICATIONS: There were no complications.  ENDOSCOPIC IMPRESSION: 1.   Diminutive polyp was found in the ascending colon;  polypectomy was performed with a cold snare 2.   Moderate diverticulosis was noted in the right colon and left colon 3.   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   The colon mucosa was otherwise normal  RECOMMENDATIONS: 1. Return to the care of your primary provider.  GI follow up as needed 2. I do not recommend routine Hemoccult studies in this patient in the future   eSigned:  Roxy Cedar, MD 06/07/2013 11:01 AM   cc: Guerry Bruin, MD and The Patient   PATIENT NAME:  Nathaniel, Watson MR#: [id number]

## 2013-06-08 ENCOUNTER — Telehealth: Payer: Self-pay

## 2013-06-08 NOTE — Telephone Encounter (Signed)
No answer, left message

## 2013-06-12 ENCOUNTER — Encounter: Payer: Self-pay | Admitting: Internal Medicine

## 2015-01-07 DIAGNOSIS — I10 Essential (primary) hypertension: Secondary | ICD-10-CM | POA: Diagnosis not present

## 2015-01-07 DIAGNOSIS — Z125 Encounter for screening for malignant neoplasm of prostate: Secondary | ICD-10-CM | POA: Diagnosis not present

## 2015-01-07 DIAGNOSIS — E785 Hyperlipidemia, unspecified: Secondary | ICD-10-CM | POA: Diagnosis not present

## 2015-01-14 DIAGNOSIS — N529 Male erectile dysfunction, unspecified: Secondary | ICD-10-CM | POA: Diagnosis not present

## 2015-01-14 DIAGNOSIS — N401 Enlarged prostate with lower urinary tract symptoms: Secondary | ICD-10-CM | POA: Diagnosis not present

## 2015-01-14 DIAGNOSIS — I10 Essential (primary) hypertension: Secondary | ICD-10-CM | POA: Diagnosis not present

## 2015-01-14 DIAGNOSIS — B91 Sequelae of poliomyelitis: Secondary | ICD-10-CM | POA: Diagnosis not present

## 2015-01-14 DIAGNOSIS — M1990 Unspecified osteoarthritis, unspecified site: Secondary | ICD-10-CM | POA: Diagnosis not present

## 2015-01-14 DIAGNOSIS — R809 Proteinuria, unspecified: Secondary | ICD-10-CM | POA: Diagnosis not present

## 2015-01-14 DIAGNOSIS — Z0000 Encounter for general adult medical examination without abnormal findings: Secondary | ICD-10-CM | POA: Diagnosis not present

## 2015-01-14 DIAGNOSIS — E785 Hyperlipidemia, unspecified: Secondary | ICD-10-CM | POA: Diagnosis not present

## 2015-08-09 DIAGNOSIS — Z23 Encounter for immunization: Secondary | ICD-10-CM | POA: Diagnosis not present

## 2016-01-08 DIAGNOSIS — H5210 Myopia, unspecified eye: Secondary | ICD-10-CM | POA: Diagnosis not present

## 2016-01-08 DIAGNOSIS — H5203 Hypermetropia, bilateral: Secondary | ICD-10-CM | POA: Diagnosis not present

## 2016-01-09 DIAGNOSIS — Z125 Encounter for screening for malignant neoplasm of prostate: Secondary | ICD-10-CM | POA: Diagnosis not present

## 2016-01-09 DIAGNOSIS — I10 Essential (primary) hypertension: Secondary | ICD-10-CM | POA: Diagnosis not present

## 2016-01-09 DIAGNOSIS — E784 Other hyperlipidemia: Secondary | ICD-10-CM | POA: Diagnosis not present

## 2016-01-16 DIAGNOSIS — R808 Other proteinuria: Secondary | ICD-10-CM | POA: Diagnosis not present

## 2016-01-16 DIAGNOSIS — E7800 Pure hypercholesterolemia, unspecified: Secondary | ICD-10-CM | POA: Diagnosis not present

## 2016-01-16 DIAGNOSIS — D692 Other nonthrombocytopenic purpura: Secondary | ICD-10-CM | POA: Diagnosis not present

## 2016-01-16 DIAGNOSIS — B91 Sequelae of poliomyelitis: Secondary | ICD-10-CM | POA: Diagnosis not present

## 2016-01-16 DIAGNOSIS — C61 Malignant neoplasm of prostate: Secondary | ICD-10-CM | POA: Diagnosis not present

## 2016-01-16 DIAGNOSIS — Z0000 Encounter for general adult medical examination without abnormal findings: Secondary | ICD-10-CM | POA: Diagnosis not present

## 2016-01-16 DIAGNOSIS — M1990 Unspecified osteoarthritis, unspecified site: Secondary | ICD-10-CM | POA: Diagnosis not present

## 2016-01-16 DIAGNOSIS — N529 Male erectile dysfunction, unspecified: Secondary | ICD-10-CM | POA: Diagnosis not present

## 2016-01-16 DIAGNOSIS — N401 Enlarged prostate with lower urinary tract symptoms: Secondary | ICD-10-CM | POA: Diagnosis not present

## 2016-03-11 DIAGNOSIS — L814 Other melanin hyperpigmentation: Secondary | ICD-10-CM | POA: Diagnosis not present

## 2016-03-11 DIAGNOSIS — L821 Other seborrheic keratosis: Secondary | ICD-10-CM | POA: Diagnosis not present

## 2016-03-11 DIAGNOSIS — D2272 Melanocytic nevi of left lower limb, including hip: Secondary | ICD-10-CM | POA: Diagnosis not present

## 2016-03-11 DIAGNOSIS — D2262 Melanocytic nevi of left upper limb, including shoulder: Secondary | ICD-10-CM | POA: Diagnosis not present

## 2016-03-11 DIAGNOSIS — D2261 Melanocytic nevi of right upper limb, including shoulder: Secondary | ICD-10-CM | POA: Diagnosis not present

## 2016-03-11 DIAGNOSIS — C44612 Basal cell carcinoma of skin of right upper limb, including shoulder: Secondary | ICD-10-CM | POA: Diagnosis not present

## 2016-03-11 DIAGNOSIS — D225 Melanocytic nevi of trunk: Secondary | ICD-10-CM | POA: Diagnosis not present

## 2016-03-11 DIAGNOSIS — L570 Actinic keratosis: Secondary | ICD-10-CM | POA: Diagnosis not present

## 2016-03-11 DIAGNOSIS — D1801 Hemangioma of skin and subcutaneous tissue: Secondary | ICD-10-CM | POA: Diagnosis not present

## 2016-03-11 DIAGNOSIS — C44519 Basal cell carcinoma of skin of other part of trunk: Secondary | ICD-10-CM | POA: Diagnosis not present

## 2016-03-11 DIAGNOSIS — C44619 Basal cell carcinoma of skin of left upper limb, including shoulder: Secondary | ICD-10-CM | POA: Diagnosis not present

## 2016-04-08 DIAGNOSIS — C44519 Basal cell carcinoma of skin of other part of trunk: Secondary | ICD-10-CM | POA: Diagnosis not present

## 2016-04-08 DIAGNOSIS — Z85828 Personal history of other malignant neoplasm of skin: Secondary | ICD-10-CM | POA: Diagnosis not present

## 2016-08-07 DIAGNOSIS — Z23 Encounter for immunization: Secondary | ICD-10-CM | POA: Diagnosis not present

## 2016-11-10 DIAGNOSIS — D1801 Hemangioma of skin and subcutaneous tissue: Secondary | ICD-10-CM | POA: Diagnosis not present

## 2016-11-10 DIAGNOSIS — C44619 Basal cell carcinoma of skin of left upper limb, including shoulder: Secondary | ICD-10-CM | POA: Diagnosis not present

## 2016-11-10 DIAGNOSIS — Z85828 Personal history of other malignant neoplasm of skin: Secondary | ICD-10-CM | POA: Diagnosis not present

## 2016-11-10 DIAGNOSIS — L570 Actinic keratosis: Secondary | ICD-10-CM | POA: Diagnosis not present

## 2016-11-10 DIAGNOSIS — D225 Melanocytic nevi of trunk: Secondary | ICD-10-CM | POA: Diagnosis not present

## 2016-11-10 DIAGNOSIS — L821 Other seborrheic keratosis: Secondary | ICD-10-CM | POA: Diagnosis not present

## 2017-01-10 DIAGNOSIS — E7800 Pure hypercholesterolemia, unspecified: Secondary | ICD-10-CM | POA: Diagnosis not present

## 2017-01-10 DIAGNOSIS — Z125 Encounter for screening for malignant neoplasm of prostate: Secondary | ICD-10-CM | POA: Diagnosis not present

## 2017-01-10 DIAGNOSIS — I10 Essential (primary) hypertension: Secondary | ICD-10-CM | POA: Diagnosis not present

## 2017-01-18 DIAGNOSIS — N401 Enlarged prostate with lower urinary tract symptoms: Secondary | ICD-10-CM | POA: Diagnosis not present

## 2017-01-18 DIAGNOSIS — N529 Male erectile dysfunction, unspecified: Secondary | ICD-10-CM | POA: Diagnosis not present

## 2017-01-18 DIAGNOSIS — Z0000 Encounter for general adult medical examination without abnormal findings: Secondary | ICD-10-CM | POA: Diagnosis not present

## 2017-01-18 DIAGNOSIS — C61 Malignant neoplasm of prostate: Secondary | ICD-10-CM | POA: Diagnosis not present

## 2017-01-18 DIAGNOSIS — E7800 Pure hypercholesterolemia, unspecified: Secondary | ICD-10-CM | POA: Diagnosis not present

## 2017-01-18 DIAGNOSIS — D692 Other nonthrombocytopenic purpura: Secondary | ICD-10-CM | POA: Diagnosis not present

## 2017-01-18 DIAGNOSIS — B91 Sequelae of poliomyelitis: Secondary | ICD-10-CM | POA: Diagnosis not present

## 2017-01-18 DIAGNOSIS — Z6829 Body mass index (BMI) 29.0-29.9, adult: Secondary | ICD-10-CM | POA: Diagnosis not present

## 2017-01-18 DIAGNOSIS — R808 Other proteinuria: Secondary | ICD-10-CM | POA: Diagnosis not present

## 2017-03-17 DIAGNOSIS — H5203 Hypermetropia, bilateral: Secondary | ICD-10-CM | POA: Diagnosis not present

## 2017-08-06 DIAGNOSIS — Z23 Encounter for immunization: Secondary | ICD-10-CM | POA: Diagnosis not present

## 2017-11-10 DIAGNOSIS — Z85828 Personal history of other malignant neoplasm of skin: Secondary | ICD-10-CM | POA: Diagnosis not present

## 2017-11-10 DIAGNOSIS — L570 Actinic keratosis: Secondary | ICD-10-CM | POA: Diagnosis not present

## 2017-11-10 DIAGNOSIS — L814 Other melanin hyperpigmentation: Secondary | ICD-10-CM | POA: Diagnosis not present

## 2017-11-10 DIAGNOSIS — D225 Melanocytic nevi of trunk: Secondary | ICD-10-CM | POA: Diagnosis not present

## 2017-11-10 DIAGNOSIS — D1801 Hemangioma of skin and subcutaneous tissue: Secondary | ICD-10-CM | POA: Diagnosis not present

## 2017-11-10 DIAGNOSIS — L821 Other seborrheic keratosis: Secondary | ICD-10-CM | POA: Diagnosis not present

## 2018-01-17 DIAGNOSIS — I10 Essential (primary) hypertension: Secondary | ICD-10-CM | POA: Diagnosis not present

## 2018-01-17 DIAGNOSIS — E7800 Pure hypercholesterolemia, unspecified: Secondary | ICD-10-CM | POA: Diagnosis not present

## 2018-01-17 DIAGNOSIS — Z125 Encounter for screening for malignant neoplasm of prostate: Secondary | ICD-10-CM | POA: Diagnosis not present

## 2018-01-17 DIAGNOSIS — R82998 Other abnormal findings in urine: Secondary | ICD-10-CM | POA: Diagnosis not present

## 2018-01-24 DIAGNOSIS — D692 Other nonthrombocytopenic purpura: Secondary | ICD-10-CM | POA: Diagnosis not present

## 2018-01-24 DIAGNOSIS — C61 Malignant neoplasm of prostate: Secondary | ICD-10-CM | POA: Diagnosis not present

## 2018-01-24 DIAGNOSIS — N528 Other male erectile dysfunction: Secondary | ICD-10-CM | POA: Diagnosis not present

## 2018-01-24 DIAGNOSIS — N401 Enlarged prostate with lower urinary tract symptoms: Secondary | ICD-10-CM | POA: Diagnosis not present

## 2018-01-24 DIAGNOSIS — B91 Sequelae of poliomyelitis: Secondary | ICD-10-CM | POA: Diagnosis not present

## 2018-01-24 DIAGNOSIS — I10 Essential (primary) hypertension: Secondary | ICD-10-CM | POA: Diagnosis not present

## 2018-01-24 DIAGNOSIS — Z0000 Encounter for general adult medical examination without abnormal findings: Secondary | ICD-10-CM | POA: Diagnosis not present

## 2018-01-24 DIAGNOSIS — C4491 Basal cell carcinoma of skin, unspecified: Secondary | ICD-10-CM | POA: Diagnosis not present

## 2018-01-24 DIAGNOSIS — R808 Other proteinuria: Secondary | ICD-10-CM | POA: Diagnosis not present

## 2018-08-05 DIAGNOSIS — Z23 Encounter for immunization: Secondary | ICD-10-CM | POA: Diagnosis not present

## 2018-11-16 DIAGNOSIS — D1801 Hemangioma of skin and subcutaneous tissue: Secondary | ICD-10-CM | POA: Diagnosis not present

## 2018-11-16 DIAGNOSIS — L720 Epidermal cyst: Secondary | ICD-10-CM | POA: Diagnosis not present

## 2018-11-16 DIAGNOSIS — C44319 Basal cell carcinoma of skin of other parts of face: Secondary | ICD-10-CM | POA: Diagnosis not present

## 2018-11-16 DIAGNOSIS — L985 Mucinosis of the skin: Secondary | ICD-10-CM | POA: Diagnosis not present

## 2018-11-16 DIAGNOSIS — L570 Actinic keratosis: Secondary | ICD-10-CM | POA: Diagnosis not present

## 2018-11-16 DIAGNOSIS — D485 Neoplasm of uncertain behavior of skin: Secondary | ICD-10-CM | POA: Diagnosis not present

## 2018-11-16 DIAGNOSIS — Z85828 Personal history of other malignant neoplasm of skin: Secondary | ICD-10-CM | POA: Diagnosis not present

## 2018-11-16 DIAGNOSIS — D2262 Melanocytic nevi of left upper limb, including shoulder: Secondary | ICD-10-CM | POA: Diagnosis not present

## 2018-11-16 DIAGNOSIS — L821 Other seborrheic keratosis: Secondary | ICD-10-CM | POA: Diagnosis not present

## 2019-01-23 DIAGNOSIS — E7800 Pure hypercholesterolemia, unspecified: Secondary | ICD-10-CM | POA: Diagnosis not present

## 2019-01-23 DIAGNOSIS — I10 Essential (primary) hypertension: Secondary | ICD-10-CM | POA: Diagnosis not present

## 2019-01-23 DIAGNOSIS — Z125 Encounter for screening for malignant neoplasm of prostate: Secondary | ICD-10-CM | POA: Diagnosis not present

## 2019-01-30 DIAGNOSIS — C61 Malignant neoplasm of prostate: Secondary | ICD-10-CM | POA: Diagnosis not present

## 2019-01-30 DIAGNOSIS — Z1339 Encounter for screening examination for other mental health and behavioral disorders: Secondary | ICD-10-CM | POA: Diagnosis not present

## 2019-01-30 DIAGNOSIS — Z0000 Encounter for general adult medical examination without abnormal findings: Secondary | ICD-10-CM | POA: Diagnosis not present

## 2019-01-30 DIAGNOSIS — E7800 Pure hypercholesterolemia, unspecified: Secondary | ICD-10-CM | POA: Diagnosis not present

## 2019-01-30 DIAGNOSIS — Z1331 Encounter for screening for depression: Secondary | ICD-10-CM | POA: Diagnosis not present

## 2019-01-30 DIAGNOSIS — B91 Sequelae of poliomyelitis: Secondary | ICD-10-CM | POA: Diagnosis not present

## 2019-01-30 DIAGNOSIS — I10 Essential (primary) hypertension: Secondary | ICD-10-CM | POA: Diagnosis not present

## 2019-01-30 DIAGNOSIS — M1990 Unspecified osteoarthritis, unspecified site: Secondary | ICD-10-CM | POA: Diagnosis not present

## 2019-01-30 DIAGNOSIS — R808 Other proteinuria: Secondary | ICD-10-CM | POA: Diagnosis not present

## 2019-01-30 DIAGNOSIS — R82998 Other abnormal findings in urine: Secondary | ICD-10-CM | POA: Diagnosis not present

## 2019-08-23 DIAGNOSIS — Z23 Encounter for immunization: Secondary | ICD-10-CM | POA: Diagnosis not present

## 2020-02-13 DIAGNOSIS — Z125 Encounter for screening for malignant neoplasm of prostate: Secondary | ICD-10-CM | POA: Diagnosis not present

## 2020-02-13 DIAGNOSIS — E7800 Pure hypercholesterolemia, unspecified: Secondary | ICD-10-CM | POA: Diagnosis not present

## 2020-02-13 DIAGNOSIS — Z0000 Encounter for general adult medical examination without abnormal findings: Secondary | ICD-10-CM | POA: Diagnosis not present

## 2020-02-19 DIAGNOSIS — M1990 Unspecified osteoarthritis, unspecified site: Secondary | ICD-10-CM | POA: Diagnosis not present

## 2020-02-19 DIAGNOSIS — C61 Malignant neoplasm of prostate: Secondary | ICD-10-CM | POA: Diagnosis not present

## 2020-02-19 DIAGNOSIS — R809 Proteinuria, unspecified: Secondary | ICD-10-CM | POA: Diagnosis not present

## 2020-02-19 DIAGNOSIS — Z0000 Encounter for general adult medical examination without abnormal findings: Secondary | ICD-10-CM | POA: Diagnosis not present

## 2020-02-19 DIAGNOSIS — N401 Enlarged prostate with lower urinary tract symptoms: Secondary | ICD-10-CM | POA: Diagnosis not present

## 2020-02-19 DIAGNOSIS — I10 Essential (primary) hypertension: Secondary | ICD-10-CM | POA: Diagnosis not present

## 2020-02-19 DIAGNOSIS — B91 Sequelae of poliomyelitis: Secondary | ICD-10-CM | POA: Diagnosis not present

## 2020-02-19 DIAGNOSIS — E7800 Pure hypercholesterolemia, unspecified: Secondary | ICD-10-CM | POA: Diagnosis not present

## 2020-02-19 DIAGNOSIS — R82998 Other abnormal findings in urine: Secondary | ICD-10-CM | POA: Diagnosis not present

## 2020-02-19 DIAGNOSIS — D692 Other nonthrombocytopenic purpura: Secondary | ICD-10-CM | POA: Diagnosis not present

## 2020-02-19 DIAGNOSIS — N529 Male erectile dysfunction, unspecified: Secondary | ICD-10-CM | POA: Diagnosis not present

## 2020-03-10 DIAGNOSIS — D225 Melanocytic nevi of trunk: Secondary | ICD-10-CM | POA: Diagnosis not present

## 2020-03-10 DIAGNOSIS — L814 Other melanin hyperpigmentation: Secondary | ICD-10-CM | POA: Diagnosis not present

## 2020-03-10 DIAGNOSIS — D1801 Hemangioma of skin and subcutaneous tissue: Secondary | ICD-10-CM | POA: Diagnosis not present

## 2020-03-10 DIAGNOSIS — Z85828 Personal history of other malignant neoplasm of skin: Secondary | ICD-10-CM | POA: Diagnosis not present

## 2020-03-10 DIAGNOSIS — L821 Other seborrheic keratosis: Secondary | ICD-10-CM | POA: Diagnosis not present

## 2020-03-10 DIAGNOSIS — L570 Actinic keratosis: Secondary | ICD-10-CM | POA: Diagnosis not present

## 2020-03-10 DIAGNOSIS — D044 Carcinoma in situ of skin of scalp and neck: Secondary | ICD-10-CM | POA: Diagnosis not present

## 2020-08-09 DIAGNOSIS — Z23 Encounter for immunization: Secondary | ICD-10-CM | POA: Diagnosis not present

## 2020-09-22 DIAGNOSIS — M66821 Spontaneous rupture of other tendons, right upper arm: Secondary | ICD-10-CM | POA: Diagnosis not present

## 2020-10-06 DIAGNOSIS — I10 Essential (primary) hypertension: Secondary | ICD-10-CM | POA: Diagnosis not present

## 2020-10-06 DIAGNOSIS — R29898 Other symptoms and signs involving the musculoskeletal system: Secondary | ICD-10-CM | POA: Diagnosis not present

## 2020-10-06 DIAGNOSIS — B91 Sequelae of poliomyelitis: Secondary | ICD-10-CM | POA: Diagnosis not present

## 2020-10-06 DIAGNOSIS — Z7982 Long term (current) use of aspirin: Secondary | ICD-10-CM | POA: Diagnosis not present

## 2020-10-06 DIAGNOSIS — S46211D Strain of muscle, fascia and tendon of other parts of biceps, right arm, subsequent encounter: Secondary | ICD-10-CM | POA: Diagnosis not present

## 2020-10-13 DIAGNOSIS — R29898 Other symptoms and signs involving the musculoskeletal system: Secondary | ICD-10-CM | POA: Diagnosis not present

## 2020-10-13 DIAGNOSIS — S46211D Strain of muscle, fascia and tendon of other parts of biceps, right arm, subsequent encounter: Secondary | ICD-10-CM | POA: Diagnosis not present

## 2020-10-13 DIAGNOSIS — Z7982 Long term (current) use of aspirin: Secondary | ICD-10-CM | POA: Diagnosis not present

## 2020-10-13 DIAGNOSIS — I10 Essential (primary) hypertension: Secondary | ICD-10-CM | POA: Diagnosis not present

## 2020-10-13 DIAGNOSIS — B91 Sequelae of poliomyelitis: Secondary | ICD-10-CM | POA: Diagnosis not present

## 2020-10-14 DIAGNOSIS — R69 Illness, unspecified: Secondary | ICD-10-CM | POA: Diagnosis not present

## 2020-10-20 DIAGNOSIS — S46211D Strain of muscle, fascia and tendon of other parts of biceps, right arm, subsequent encounter: Secondary | ICD-10-CM | POA: Diagnosis not present

## 2020-10-20 DIAGNOSIS — B91 Sequelae of poliomyelitis: Secondary | ICD-10-CM | POA: Diagnosis not present

## 2020-10-20 DIAGNOSIS — I10 Essential (primary) hypertension: Secondary | ICD-10-CM | POA: Diagnosis not present

## 2020-10-20 DIAGNOSIS — R29898 Other symptoms and signs involving the musculoskeletal system: Secondary | ICD-10-CM | POA: Diagnosis not present

## 2020-10-20 DIAGNOSIS — Z7982 Long term (current) use of aspirin: Secondary | ICD-10-CM | POA: Diagnosis not present

## 2020-10-28 DIAGNOSIS — I10 Essential (primary) hypertension: Secondary | ICD-10-CM | POA: Diagnosis not present

## 2020-10-28 DIAGNOSIS — S46211D Strain of muscle, fascia and tendon of other parts of biceps, right arm, subsequent encounter: Secondary | ICD-10-CM | POA: Diagnosis not present

## 2020-10-28 DIAGNOSIS — R29898 Other symptoms and signs involving the musculoskeletal system: Secondary | ICD-10-CM | POA: Diagnosis not present

## 2020-10-28 DIAGNOSIS — B91 Sequelae of poliomyelitis: Secondary | ICD-10-CM | POA: Diagnosis not present

## 2020-10-28 DIAGNOSIS — Z7982 Long term (current) use of aspirin: Secondary | ICD-10-CM | POA: Diagnosis not present

## 2021-02-17 DIAGNOSIS — E7800 Pure hypercholesterolemia, unspecified: Secondary | ICD-10-CM | POA: Diagnosis not present

## 2021-02-17 DIAGNOSIS — Z125 Encounter for screening for malignant neoplasm of prostate: Secondary | ICD-10-CM | POA: Diagnosis not present

## 2021-02-24 DIAGNOSIS — I10 Essential (primary) hypertension: Secondary | ICD-10-CM | POA: Diagnosis not present

## 2021-02-24 DIAGNOSIS — Z1339 Encounter for screening examination for other mental health and behavioral disorders: Secondary | ICD-10-CM | POA: Diagnosis not present

## 2021-02-24 DIAGNOSIS — E7800 Pure hypercholesterolemia, unspecified: Secondary | ICD-10-CM | POA: Diagnosis not present

## 2021-02-24 DIAGNOSIS — M1990 Unspecified osteoarthritis, unspecified site: Secondary | ICD-10-CM | POA: Diagnosis not present

## 2021-02-24 DIAGNOSIS — D692 Other nonthrombocytopenic purpura: Secondary | ICD-10-CM | POA: Diagnosis not present

## 2021-02-24 DIAGNOSIS — B91 Sequelae of poliomyelitis: Secondary | ICD-10-CM | POA: Diagnosis not present

## 2021-02-24 DIAGNOSIS — R82998 Other abnormal findings in urine: Secondary | ICD-10-CM | POA: Diagnosis not present

## 2021-02-24 DIAGNOSIS — Z0000 Encounter for general adult medical examination without abnormal findings: Secondary | ICD-10-CM | POA: Diagnosis not present

## 2021-02-24 DIAGNOSIS — Z1331 Encounter for screening for depression: Secondary | ICD-10-CM | POA: Diagnosis not present

## 2021-03-11 DIAGNOSIS — D2272 Melanocytic nevi of left lower limb, including hip: Secondary | ICD-10-CM | POA: Diagnosis not present

## 2021-03-11 DIAGNOSIS — Z85828 Personal history of other malignant neoplasm of skin: Secondary | ICD-10-CM | POA: Diagnosis not present

## 2021-03-11 DIAGNOSIS — D0461 Carcinoma in situ of skin of right upper limb, including shoulder: Secondary | ICD-10-CM | POA: Diagnosis not present

## 2021-03-11 DIAGNOSIS — D1801 Hemangioma of skin and subcutaneous tissue: Secondary | ICD-10-CM | POA: Diagnosis not present

## 2021-03-11 DIAGNOSIS — L814 Other melanin hyperpigmentation: Secondary | ICD-10-CM | POA: Diagnosis not present

## 2021-03-11 DIAGNOSIS — B078 Other viral warts: Secondary | ICD-10-CM | POA: Diagnosis not present

## 2021-03-11 DIAGNOSIS — L570 Actinic keratosis: Secondary | ICD-10-CM | POA: Diagnosis not present

## 2021-03-11 DIAGNOSIS — D225 Melanocytic nevi of trunk: Secondary | ICD-10-CM | POA: Diagnosis not present

## 2021-03-11 DIAGNOSIS — L821 Other seborrheic keratosis: Secondary | ICD-10-CM | POA: Diagnosis not present

## 2021-10-15 DIAGNOSIS — Z23 Encounter for immunization: Secondary | ICD-10-CM | POA: Diagnosis not present

## 2022-04-28 ENCOUNTER — Other Ambulatory Visit: Payer: Self-pay

## 2022-04-28 ENCOUNTER — Emergency Department (HOSPITAL_COMMUNITY)
Admission: EM | Admit: 2022-04-28 | Discharge: 2022-04-28 | Disposition: A | Discharge status: Home / Self Care | Payer: Medicare Other | Attending: Emergency Medicine | Admitting: Emergency Medicine

## 2022-04-28 ENCOUNTER — Emergency Department (HOSPITAL_COMMUNITY): Payer: Medicare Other

## 2022-04-28 ENCOUNTER — Encounter (HOSPITAL_COMMUNITY): Payer: Self-pay

## 2022-04-28 DIAGNOSIS — S32029A Unspecified fracture of second lumbar vertebra, initial encounter for closed fracture: Secondary | ICD-10-CM | POA: Diagnosis not present

## 2022-04-28 DIAGNOSIS — S3992XA Unspecified injury of lower back, initial encounter: Secondary | ICD-10-CM | POA: Diagnosis present

## 2022-04-28 DIAGNOSIS — W19XXXA Unspecified fall, initial encounter: Secondary | ICD-10-CM | POA: Diagnosis not present

## 2022-04-28 DIAGNOSIS — Y92009 Unspecified place in unspecified non-institutional (private) residence as the place of occurrence of the external cause: Secondary | ICD-10-CM | POA: Diagnosis not present

## 2022-04-28 DIAGNOSIS — Z7982 Long term (current) use of aspirin: Secondary | ICD-10-CM | POA: Diagnosis not present

## 2022-04-28 DIAGNOSIS — S0033XA Contusion of nose, initial encounter: Secondary | ICD-10-CM | POA: Diagnosis not present

## 2022-04-28 DIAGNOSIS — R531 Weakness: Secondary | ICD-10-CM | POA: Diagnosis not present

## 2022-04-28 DIAGNOSIS — Z8781 Personal history of (healed) traumatic fracture: Secondary | ICD-10-CM

## 2022-04-28 DIAGNOSIS — S32020A Wedge compression fracture of second lumbar vertebra, initial encounter for closed fracture: Secondary | ICD-10-CM

## 2022-04-28 HISTORY — DX: Personal history of (healed) traumatic fracture: Z87.81

## 2022-04-28 MED ORDER — IBUPROFEN 200 MG PO TABS
600.0000 mg | ORAL_TABLET | Freq: Once | ORAL | Status: AC
  Administered 2022-04-28: 600 mg via ORAL
  Filled 2022-04-28: qty 3

## 2022-04-28 MED ORDER — ACETAMINOPHEN 325 MG PO TABS
650.0000 mg | ORAL_TABLET | Freq: Once | ORAL | Status: AC
  Administered 2022-04-28: 650 mg via ORAL
  Filled 2022-04-28: qty 2

## 2022-04-28 MED ORDER — IBUPROFEN 600 MG PO TABS: 600.0000 mg | ORAL_TABLET | Freq: Three times a day (TID) | ORAL | 0 refills | Status: DC | PRN

## 2022-04-28 MED ORDER — ACETAMINOPHEN 325 MG PO TABS: 650.0000 mg | ORAL_TABLET | Freq: Four times a day (QID) | ORAL | 0 refills | Status: DC | PRN

## 2022-04-28 NOTE — Progress Notes (Signed)
Orthopedic Tech Progress Note Patient Details:  Nathaniel Watson 05/10/1932 [id number]  Patient ID: [name], male   DOB: 10/04/1932, 88 y.o.   MRN: [id number]  [name] 04/28/2022, 3:55 PM TLSO ordered from [facility]

## 2022-04-28 NOTE — ED Provider Triage Note (Signed)
Emergency Medicine Provider Triage Evaluation Note  Nathaniel Watson , a 88 y.o. male  was evaluated in triage.  Pt complains of low back pain.  Symptoms started about 5 days ago when he had a fall.  He fell straight back onto his back.  He did not lose consciousness and only had some lower back pain after the fall.  He went out with his children to eat the following day and was given ibuprofen which she has been taking since that time.  Patient had another fall, this time forward, 2 days ago.  He had some bruising to the bridge of his nose.  His lower back hurt more after this fall.  He did not feel that he had significant injuries and declined to come after that.  Review of Systems  Positive: Low back pain Negative: Fever  Physical Exam  BP (!) 175/84 (BP Location: Left Arm)   Pulse 87   Temp 97.7 F (36.5 C) (Oral)   Resp 18   SpO2 95%  Gen:   Awake, no distress   Resp:  Normal effort  MSK:   Moves extremities without difficulty  Other:  Midline low back pain, no stepoffs  Medical Decision Making  Medically screening exam initiated at 2:27 PM.  Appropriate orders placed.  Nathaniel Watson was informed that the remainder of the evaluation will be completed by another provider, this initial triage assessment does not replace that evaluation, and the importance of remaining in the ED until their evaluation is complete.     Carlisle Cater, PA-C 04/28/22 1429

## 2022-04-28 NOTE — ED Notes (Signed)
[facility] contacted back reference family advising they would take the pt home POV instead of waiting on EMS transport

## 2022-04-28 NOTE — Discharge Instructions (Addendum)
You have a compression fracture of the lumbar spine (L2).  You will need to follow-up with a spine specialist, the phone number was provided for [facility] neurosurgery.  Please contact their office tomorrow and schedule follow-up appointment in 2 weeks.  In the meantime, you should wear the back brace that was provided at all times, except when showering.  This brace will help relieve some pressure off of your spine, and keep the fracture stable from getting worse.  Please take your time when standing up to prevent further falls.  For pain you can begin by taking the Tylenol prescribed every 6 hours regularly.  For more intense pain, you can also use the ibuprofen prescribed every 8 hours.  You can also contact your primary care doctor's office for a follow-up visit if you have further questions or concerns before seeing your specialist.

## 2022-04-28 NOTE — ED Triage Notes (Signed)
Pt BIB EMS from home. Pt reports falling on Friday and Monday. Pt now endorses lower back pain. Pt also has abrasion to nose from fall. Denies LOC. A&O x4. Pt normally uses walker and wheelchair at home.

## 2022-04-28 NOTE — ED Provider Notes (Signed)
[place] DEPT Provider Note   CSN: [id number] Arrival date & time: 04/28/22  1358     History  Chief Complaint  Patient presents with   Back Pain    Nathaniel Watson is a 88 y.o. male present emerged department lower back pain.  The patient lives independently in his home.  He has limited mobility at baseline due to "weakness in my legs" which has been ongoing for a long time, typically uses a walker and is able to get around the house, which is handicap accessible.  He reports that he had a mechanical fall 5 days ago on Friday, or he landed on his backside, was having no significant pain at the time, but the next morning was having a lot of soreness in his lower back.  He fell again 3 days ago.  Now is having pain in his lower back, which is particularly worse when trying to get up from a sitting position.  He reports that once he is standing he is able to ambulate, and while he is sitting in recliner that he has no significant pain.  He has been using ibuprofen at home but not providing enough relief.  He does live by himself, ports his son is moving into live with him on Friday, in 2 days.  He reports that his second fall was face forward and struck his nose on the ground from standing.  He had some bruising at the bridge of the nose, has had no significant headache over the past 48 hours, he is not on blood thinners  HPI     Home Medications Prior to Admission medications   Medication Sig Start Date End Date Taking? Authorizing Provider  acetaminophen (TYLENOL) 325 MG tablet Take 2 tablets (650 mg total) by mouth every 6 (six) hours as needed for up to 30 doses for moderate pain or mild pain. 04/28/22  Yes Kelleen Stolze, Carola Rhine, MD  ibuprofen (ADVIL) 600 MG tablet Take 1 tablet (600 mg total) by mouth every 8 (eight) hours as needed for up to 21 doses for moderate pain. 04/28/22  Yes Wyvonnia Dusky, MD  aspirin 81 MG tablet Take 81 mg by mouth daily.     [provider]  Cyanocobalamin (B-12) 2500 MCG SUBL Place 2,500 mcg under the tongue daily.    [provider]  fish oil-omega-3 fatty acids 1000 MG capsule Take 1,200 mg by mouth 3 (three) times daily.    [provider]  losartan-hydrochlorothiazide (HYZAAR) 50-12.5 MG per tablet Take 1 tablet by mouth daily.    [provider]  Multiple Vitamin (MULTIVITAMIN) tablet Take 1 tablet by mouth daily.    [provider]      Allergies    Patient has no known allergies.    Review of Systems   Review of Systems  Physical Exam Updated Vital Signs BP (!) 148/77 (BP Location: Left Arm)   Pulse 71   Temp 97.7 F (36.5 C) (Oral)   Resp 16   SpO2 95%  Physical Exam Constitutional:      General: He is not in acute distress. HENT:     Head: Normocephalic.     Comments: Contusion to the bridge of the nose. Eyes:     Conjunctiva/sclera: Conjunctivae normal.     Pupils: Pupils are equal, round, and reactive to light.  Cardiovascular:     Rate and Rhythm: Normal rate and regular rhythm.  Pulmonary:     Effort: Pulmonary effort  is normal. No respiratory distress.  Musculoskeletal:     Comments: L spine midline tenderness, ttp along bilateral iliac crest No significant discomfort with active ROM of bilateral hips (hip flexion) - patient has chronic weakness on strength testing of lower extremities, 3/5 hip strength bilaterally, he reports is unchanged  Skin:    General: Skin is warm and dry.  Neurological:     General: No focal deficit present.     Mental Status: He is alert and oriented to person, place, and time. Mental status is at baseline.  Psychiatric:        Mood and Affect: Mood normal.        Behavior: Behavior normal.     ED Results / Procedures / Treatments   Labs (all labs ordered are listed, but only abnormal results are displayed) Labs Reviewed - No data to display  EKG None  Radiology CT Lumbar Spine Wo  Contrast  Result Date: 04/28/2022 CLINICAL DATA:  Provided history: Back trauma, no prior imaging. Additional history provided: Fall 5 days ago. Low back pain. EXAM: CT LUMBAR SPINE WITHOUT CONTRAST TECHNIQUE: Multidetector CT imaging of the lumbar spine was performed without intravenous contrast administration. Multiplanar CT image reconstructions were also generated. RADIATION DOSE REDUCTION: This exam was performed according to the departmental dose-optimization program which includes automated exposure control, adjustment of the mA and/or kV according to patient size and/or use of iterative reconstruction technique. COMPARISON:  Nuclear medicine bone scan 06/10/2010. FINDINGS: Segmentation: 5 lumbar vertebrae. The caudal most well-formed intervertebral disc space is designated L5-S1. Alignment: Lumbar levocurvature. 4 mm fused grade 1 retrolisthesis at L5-S1. Vertebrae: Age-indeterminate compression deformities of the L2 superior and inferior endplates (up to 87-56% height loss) (for instance as seen on series 9, image 38). Vertebral body height is otherwise maintained. No evidence of acute fracture to the lumbar spine elsewhere. Multilevel ventrolateral osteophytes, some bridging. Paraspinal and other soft tissues: No acute finding within included portions of the abdomen/retroperitoneum. Aortoiliac atherosclerosis. No paraspinal mass or collection. Disc levels: Advanced disc space narrowing on the left at L5-S1 with fusion across the anterior and left aspect of the disc space at this level. No more than mild disc space narrowing at the remaining lumbar levels. Osseous fusion across the disc space bilaterally at L3-L4. T12-L1: Mild facet arthrosis. No appreciable significant spinal canal or foraminal stenosis. L1-L2: Disc bulge. Moderate facet arthrosis. No appreciable significant spinal canal stenosis. Mild-to-moderate bilateral neural foraminal narrowing. L2-L3: Disc bulge. Facet arthrosis (advanced right,  moderate left) with ligamentum flavum hypertrophy. No significant central canal stenosis is appreciated. Mild-to-moderate bilateral neural foraminal narrowing. L3-L4: Disc bulge. Osseous fusion across the disc space bilaterally. Facet arthrosis (moderate right, mild left). No significant central canal stenosis is appreciated. Mild bilateral neural foraminal narrowing. L4-L5: Disc bulge. Facet arthrosis (moderate right, mild left). Bilateral subarticular narrowing. Apparent mild narrowing of the central canal. Moderate bilateral neural foraminal narrowing. L5-S1: Disc bulge. Fusion across the left aspect of the disc space. Mild to moderate facet arthrosis no significant spinal canal stenosis. Bilateral neural foraminal narrowing (mild right, moderate left). IMPRESSION: Age-indeterminate compression deformities of the L2 superior and inferior endplates (up to 43-32% height loss). Correlate for point tenderness at this site. An MRI of the lumbar spine may be obtained for further evaluation, as warranted. No significant bony retropulsion. Lumbar spondylosis, as outlined. No more than mild central canal stenosis is appreciated. Multifactorial bilateral subarticular stenosis at L3-L4. Multilevel neural foraminal narrowing, as detailed and greatest bilaterally at L4-L5,  and on the left at L5-S1 (moderate at these sites). Osseous fusion across the left aspect of the disc space at L5-S1. Osseous fusion across the disc space also present at L3-L4. Lumbar levocurvature. 4 mm L5-S1 fused grade 1 retrolisthesis. Electronically Signed   By: Kellie Simmering D.O.   On: 04/28/2022 15:12    Procedures Procedures    Medications Ordered in ED Medications  ibuprofen (ADVIL) tablet 600 mg (600 mg Oral Given 04/28/22 1556)  acetaminophen (TYLENOL) tablet 650 mg (650 mg Oral Given 04/28/22 1557)    ED Course/ Medical Decision Making/ A&P Clinical Course as of 04/28/22 1814  Wed Apr 28, 2022  1520 IMPRESSION: Age-indeterminate  compression deformities of the L2 superior and inferior endplates (up to 38-93% height loss). Correlate for point tenderness at this site. An MRI of the lumbar spine may be obtained for further evaluation, as warranted. No significant bony retropulsion.  Lumbar spondylosis, as outlined. No more than mild central canal stenosis is appreciated. Multifactorial bilateral subarticular stenosis at L3-L4. Multilevel neural foraminal narrowing, as detailed and greatest bilaterally at L4-L5, and on the left at L5-S1 (moderate at these sites). Osseous fusion across the left aspect of the disc space at L5-S1. Osseous fusion across the disc space also present at L3-L4.  Lumbar levocurvature.  4 mm L5-S1 fused grade 1 retrolisthesis. [MT]  7342 Patient will be fitted with a TLSO brace.  He is wanting to go home.  He says he has been still living independently the past 3 days despite his falls, and is able to ambulate around the house, which is set up in handicap accessible.  He does not feel that the pain is limiting.  We will fit him with a brace and discharged home.  His son will be coming live with him in 2 days for additional assistance.  He will need to follow-up with the spine clinic -and was advised to contact them.  We can continue with Tylenol and ibuprofen as needed for pain.  I prefer to avoid opiates, given his weakness in the legs, I think he may be a fall risk.  He agrees with this assessment.  He is comfortable at this time. [MT]    Clinical Course User Index [MT] Lindsea Olivar, Carola Rhine, MD                           Medical Decision Making Amount and/or Complexity of Data Reviewed Radiology: ordered.  Risk OTC drugs. Prescription drug management.   Patient is here with 2 mechanical falls and pain in his lower back.  He has some ecchymosis on the bridge of his nose, and I do not suspect a displaced nasal fracture, very low suspicion for ICH at this time.  No headache after 2 days, and he  is not on blood thinners.  Do not believe needed emergent CT head at this time CT of the lumbar spine was ordered and personally reviewed and interpreted, showing L2 compression fracture 40 to 50% body height, which I suspect is acute likely the cause of his symptoms.  He is otherwise neurovascularly intact, has his baseline weakness of the lower extremities.  Have a lower suspicion for cauda equina syndrome at this time and not believe he needs emergent MRI imaging.  No other traumatic injuries noted on exam.        Final Clinical Impression(s) / ED Diagnoses Final diagnoses:  Closed compression fracture of L2 lumbar vertebra, initial encounter (  [place])    Rx / DC Orders ED Discharge Orders          Ordered    acetaminophen (TYLENOL) 325 MG tablet  Every 6 hours PRN        04/28/22 1725    ibuprofen (ADVIL) 600 MG tablet  Every 8 hours PRN        04/28/22 1725              Wyvonnia Dusky, MD 04/28/22 1814

## 2022-04-28 NOTE — ED Notes (Signed)
[facility] called for transport needs  

## 2023-04-09 DIAGNOSIS — C679 Malignant neoplasm of bladder, unspecified: Secondary | ICD-10-CM

## 2023-04-09 HISTORY — DX: Malignant neoplasm of bladder, unspecified: C67.9

## 2023-05-04 ENCOUNTER — Encounter (HOSPITAL_BASED_OUTPATIENT_CLINIC_OR_DEPARTMENT_OTHER): Payer: Self-pay | Admitting: Urology

## 2023-05-04 ENCOUNTER — Other Ambulatory Visit: Payer: Self-pay | Admitting: Urology

## 2023-05-05 ENCOUNTER — Encounter (HOSPITAL_BASED_OUTPATIENT_CLINIC_OR_DEPARTMENT_OTHER): Payer: Self-pay | Admitting: Urology

## 2023-05-05 NOTE — Progress Notes (Addendum)
Addendum:  Pt daughter called , she is concerned about her father after surgery and be able to stand / transfer from wheelchair to car after having anesthesia since his leg "do not work well" .  Stated he really needs to be using wheelchair all the time but he want give up using walker. She inquiring if we had any transportation options.  Told no we do not.  She stated she my look into getting transportation for discharge where he can stay in wheelchair , not transfer, go right into [place] and she will follow Zenaida Niece to be the responsible person .  Addendum:  Received pt's pcp Dr Wylene Simmer medical clearance via fax from Dr Marlou Porch office, placed w/ chart.  Spoke w/ via phone for pre-op interview--- pt's daughter, since pt is very HOH on the phone Lab needs dos----  istat, ekg             Lab results------ no COVID test -----patient states asymptomatic no test needed Arrive at ------- 0730 on 05-25-2023 NPO after MN NO Solid Food.  Clear liquids from MN until--- 0630 Med rec completed Medications to take morning of surgery ----- none Diabetic medication ----- n/a Patient instructed no nail polish to be worn day of surgery Patient instructed to bring photo id and insurance card day of surgery Patient aware to have Driver (ride ) / caregiver    for 24 hours after surgery -- daughter, Nathaniel Watson Patient Special Instructions ----- n/a Pre-Op special Instructions ----- pt is HOH even w/ hearing aid's may need daughter in pre-op Patient verbalized understanding of instructions that were given at this phone interview. Patient denies shortness of breath, chest pain, fever, cough at this phone interview.

## 2023-05-24 NOTE — Anesthesia Preprocedure Evaluation (Signed)
Anesthesia Evaluation  Patient identified by MRN, date of birth, ID band Patient awake    Reviewed: Allergy & Precautions, NPO status , Patient's Chart, lab work & pertinent test results  Airway Mallampati: II  TM Distance: >3 FB Neck ROM: Full    Dental no notable dental hx. (+) Implants, Dental Advisory Given, Teeth Intact   Pulmonary neg pulmonary ROS   Pulmonary exam normal breath sounds clear to auscultation       Cardiovascular hypertension, Normal cardiovascular exam Rhythm:Regular Rate:Normal     Neuro/Psych  Neuromuscular disease (s/p Polio sndrome uses walker)    GI/Hepatic Neg liver ROS,,,  Endo/Other    Renal/GU negative Renal ROS   Bladder CA    Musculoskeletal  (+) Arthritis , Osteoarthritis,    Abdominal   Peds  Hematology   Anesthesia Other Findings All: Ace inhibitor  Reproductive/Obstetrics                             Anesthesia Physical Anesthesia Plan  ASA: 3  Anesthesia Plan: General   Post-op Pain Management: Precedex and Ofirmev IV (intra-op)*   Induction: Intravenous  PONV Risk Score and Plan: Treatment may vary due to age or medical condition and Ondansetron  Airway Management Planned: Oral ETT  Additional Equipment: None  Intra-op Plan:   Post-operative Plan: Extubation in OR  Informed Consent: I have reviewed the patients History and Physical, chart, labs and discussed the procedure including the risks, benefits and alternatives for the proposed anesthesia with the patient or authorized representative who has indicated his/her understanding and acceptance.     Dental advisory given  Plan Discussed with: CRNA and Anesthesiologist  Anesthesia Plan Comments:         Anesthesia Quick Evaluation

## 2023-05-25 ENCOUNTER — Ambulatory Visit (HOSPITAL_BASED_OUTPATIENT_CLINIC_OR_DEPARTMENT_OTHER): Payer: Medicare Other | Admitting: Anesthesiology

## 2023-05-25 ENCOUNTER — Encounter (HOSPITAL_BASED_OUTPATIENT_CLINIC_OR_DEPARTMENT_OTHER)
Admission: RE | Disposition: A | Discharge status: Home / Self Care | Payer: Self-pay | Source: Home / Self Care | Attending: Urology

## 2023-05-25 ENCOUNTER — Other Ambulatory Visit: Payer: Self-pay

## 2023-05-25 ENCOUNTER — Ambulatory Visit (HOSPITAL_BASED_OUTPATIENT_CLINIC_OR_DEPARTMENT_OTHER)
Admission: RE | Admit: 2023-05-25 | Discharge: 2023-05-25 | Disposition: A | Discharge status: Home / Self Care | Payer: Medicare Other | Attending: Urology | Admitting: Urology

## 2023-05-25 ENCOUNTER — Encounter (HOSPITAL_BASED_OUTPATIENT_CLINIC_OR_DEPARTMENT_OTHER): Payer: Self-pay | Admitting: Urology

## 2023-05-25 DIAGNOSIS — C679 Malignant neoplasm of bladder, unspecified: Secondary | ICD-10-CM

## 2023-05-25 DIAGNOSIS — I10 Essential (primary) hypertension: Secondary | ICD-10-CM | POA: Insufficient documentation

## 2023-05-25 DIAGNOSIS — N3289 Other specified disorders of bladder: Secondary | ICD-10-CM | POA: Diagnosis not present

## 2023-05-25 DIAGNOSIS — Z8612 Personal history of poliomyelitis: Secondary | ICD-10-CM | POA: Insufficient documentation

## 2023-05-25 DIAGNOSIS — M1990 Unspecified osteoarthritis, unspecified site: Secondary | ICD-10-CM | POA: Diagnosis not present

## 2023-05-25 DIAGNOSIS — C672 Malignant neoplasm of lateral wall of bladder: Secondary | ICD-10-CM | POA: Diagnosis present

## 2023-05-25 DIAGNOSIS — Z8551 Personal history of malignant neoplasm of bladder: Secondary | ICD-10-CM | POA: Insufficient documentation

## 2023-05-25 DIAGNOSIS — Z01818 Encounter for other preprocedural examination: Secondary | ICD-10-CM

## 2023-05-25 HISTORY — DX: Presence of external hearing-aid: Z97.4

## 2023-05-25 HISTORY — DX: Hyperlipidemia, unspecified: E78.5

## 2023-05-25 HISTORY — DX: Other symptoms and signs involving the musculoskeletal system: R29.898

## 2023-05-25 HISTORY — DX: Unspecified osteoarthritis, unspecified site: M19.90

## 2023-05-25 HISTORY — PX: TRANSURETHRAL RESECTION OF BLADDER TUMOR: SHX2575

## 2023-05-25 HISTORY — DX: Personal history of irradiation: Z92.3

## 2023-05-25 HISTORY — DX: Presence of spectacles and contact lenses: Z97.3

## 2023-05-25 HISTORY — DX: Personal history of adenomatous and serrated colon polyps: Z86.0101

## 2023-05-25 HISTORY — DX: Male erectile dysfunction, unspecified: N52.9

## 2023-05-25 HISTORY — DX: Personal history of other malignant neoplasm of skin: Z85.828

## 2023-05-25 HISTORY — PX: CYSTOSCOPY W/ RETROGRADES: SHX1426

## 2023-05-25 HISTORY — DX: Postpolio syndrome: G14

## 2023-05-25 HISTORY — DX: Personal history of urinary calculi: Z87.442

## 2023-05-25 HISTORY — DX: Benign prostatic hyperplasia without lower urinary tract symptoms: N40.0

## 2023-05-25 HISTORY — DX: Diverticulosis of large intestine without perforation or abscess without bleeding: K57.30

## 2023-05-25 LAB — POCT I-STAT, CHEM 8
BUN: 37 mg/dL — ABNORMAL HIGH (ref 8–23)
Calcium, Ion: 1.29 mmol/L (ref 1.15–1.40)
Chloride: 106 mmol/L (ref 98–111)
Creatinine, Ser: 1.5 mg/dL — ABNORMAL HIGH (ref 0.61–1.24)
Glucose, Bld: 97 mg/dL (ref 70–99)
HCT: 38 % — ABNORMAL LOW (ref 39.0–52.0)
Hemoglobin: 12.9 g/dL — ABNORMAL LOW (ref 13.0–17.0)
Potassium: 4 mmol/L (ref 3.5–5.1)
Sodium: 143 mmol/L (ref 135–145)
TCO2: 26 mmol/L (ref 22–32)

## 2023-05-25 SURGERY — TURBT (TRANSURETHRAL RESECTION OF BLADDER TUMOR)
Anesthesia: General | Site: Renal

## 2023-05-25 MED ORDER — LIDOCAINE 2% (20 MG/ML) 5 ML SYRINGE
INTRAMUSCULAR | Status: DC | PRN
  Administered 2023-05-25: 100 mg via INTRAVENOUS

## 2023-05-25 MED ORDER — FENTANYL CITRATE (PF) 100 MCG/2ML IJ SOLN
INTRAMUSCULAR | Status: AC
  Filled 2023-05-25: qty 2

## 2023-05-25 MED ORDER — CIPROFLOXACIN IN D5W 400 MG/200ML IV SOLN
INTRAVENOUS | Status: AC
  Filled 2023-05-25: qty 200

## 2023-05-25 MED ORDER — LACTATED RINGERS IV SOLN: INTRAVENOUS | Status: DC

## 2023-05-25 MED ORDER — SODIUM CHLORIDE 0.9 % IR SOLN
Status: DC | PRN
  Administered 2023-05-25 (×2): 6000 mL via INTRAVESICAL

## 2023-05-25 MED ORDER — ACETAMINOPHEN 10 MG/ML IV SOLN
INTRAVENOUS | Status: DC | PRN
  Administered 2023-05-25: 1000 mg via INTRAVENOUS

## 2023-05-25 MED ORDER — PROPOFOL 10 MG/ML IV BOLUS
INTRAVENOUS | Status: DC | PRN
  Administered 2023-05-25: 150 mg via INTRAVENOUS

## 2023-05-25 MED ORDER — SUGAMMADEX SODIUM 200 MG/2ML IV SOLN
INTRAVENOUS | Status: DC | PRN
  Administered 2023-05-25: 360 mg via INTRAVENOUS

## 2023-05-25 MED ORDER — DEXMEDETOMIDINE HCL IN NACL 80 MCG/20ML IV SOLN
INTRAVENOUS | Status: AC
  Filled 2023-05-25: qty 20

## 2023-05-25 MED ORDER — PHENAZOPYRIDINE HCL 200 MG PO TABS: 200.0000 mg | ORAL_TABLET | Freq: Three times a day (TID) | ORAL | 0 refills | Status: AC | PRN

## 2023-05-25 MED ORDER — ACETAMINOPHEN 10 MG/ML IV SOLN
INTRAVENOUS | Status: AC
  Filled 2023-05-25: qty 100

## 2023-05-25 MED ORDER — FENTANYL CITRATE (PF) 250 MCG/5ML IJ SOLN
INTRAMUSCULAR | Status: DC | PRN
  Administered 2023-05-25: 50 ug via INTRAVENOUS
  Administered 2023-05-25 (×2): 25 ug via INTRAVENOUS

## 2023-05-25 MED ORDER — PROPOFOL 10 MG/ML IV BOLUS
INTRAVENOUS | Status: AC
  Filled 2023-05-25: qty 20

## 2023-05-25 MED ORDER — ROCURONIUM BROMIDE 10 MG/ML (PF) SYRINGE
PREFILLED_SYRINGE | INTRAVENOUS | Status: DC | PRN
  Administered 2023-05-25: 45 mg via INTRAVENOUS

## 2023-05-25 MED ORDER — TRAMADOL HCL 50 MG PO TABS: 50.0000 mg | ORAL_TABLET | Freq: Four times a day (QID) | ORAL | 0 refills | Status: DC | PRN

## 2023-05-25 MED ORDER — PHENYLEPHRINE 80 MCG/ML (10ML) SYRINGE FOR IV PUSH (FOR BLOOD PRESSURE SUPPORT)
PREFILLED_SYRINGE | INTRAVENOUS | Status: DC | PRN
  Administered 2023-05-25: 160 ug via INTRAVENOUS
  Administered 2023-05-25 (×3): 80 ug via INTRAVENOUS

## 2023-05-25 MED ORDER — WHITE PETROLATUM EX OINT
TOPICAL_OINTMENT | CUTANEOUS | Status: AC
  Filled 2023-05-25: qty 5

## 2023-05-25 MED ORDER — CIPROFLOXACIN IN D5W 400 MG/200ML IV SOLN
400.0000 mg | INTRAVENOUS | Status: AC
  Administered 2023-05-25: 400 mg via INTRAVENOUS

## 2023-05-25 MED ORDER — ONDANSETRON HCL 4 MG/2ML IJ SOLN
INTRAMUSCULAR | Status: DC | PRN
  Administered 2023-05-25: 4 mg via INTRAVENOUS

## 2023-05-25 MED ORDER — 0.9 % SODIUM CHLORIDE (POUR BTL) OPTIME
TOPICAL | Status: DC | PRN
  Administered 2023-05-25: 500 mL

## 2023-05-25 MED ORDER — IOHEXOL 300 MG/ML  SOLN
INTRAMUSCULAR | Status: DC | PRN
  Administered 2023-05-25: 15 mL via URETHRAL

## 2023-05-25 SURGICAL SUPPLY — 34 items
BAG DRAIN URO-CYSTO SKYTR STRL (DRAIN) ×2 IMPLANT
BAG DRN RND TRDRP ANRFLXCHMBR (UROLOGICAL SUPPLIES)
BAG DRN UROCATH (DRAIN) ×2
BAG URINE DRAIN 2000ML AR STRL (UROLOGICAL SUPPLIES) ×2 IMPLANT
BASKET LASER NITINOL 1.9FR (BASKET) IMPLANT
BSKT STON RTRVL 120 1.9FR (BASKET)
CATH FOLEY 3WAY 30CC 22FR (CATHETERS) ×2 IMPLANT
CATH URETERAL DUAL LUMEN 10F (MISCELLANEOUS) IMPLANT
CATH URETL OPEN 5X70 (CATHETERS) ×2 IMPLANT
CLOTH BEACON ORANGE TIMEOUT ST (SAFETY) ×2 IMPLANT
ELECT REM PT RETURN 9FT ADLT (ELECTROSURGICAL)
ELECTRODE REM PT RTRN 9FT ADLT (ELECTROSURGICAL) IMPLANT
EVACUATOR MICROVAS BLADDER (UROLOGICAL SUPPLIES) IMPLANT
EXTRACTOR STONE 1.7FRX115CM (UROLOGICAL SUPPLIES) IMPLANT
GLOVE BIO SURGEON STRL SZ7.5 (GLOVE) ×2 IMPLANT
GOWN STRL REUS W/TWL LRG LVL3 (GOWN DISPOSABLE) ×4 IMPLANT
GOWN STRL REUS W/TWL XL LVL3 (GOWN DISPOSABLE) ×2 IMPLANT
GUIDEWIRE STR DUAL SENSOR (WIRE) ×2 IMPLANT
HOLDER FOLEY CATH W/STRAP (MISCELLANEOUS) IMPLANT
IV NS IRRIG 3000ML ARTHROMATIC (IV SOLUTION) ×8 IMPLANT
KIT TURNOVER CYSTO (KITS) ×2 IMPLANT
LOOP CUT BIPOLAR 24F LRG (ELECTROSURGICAL) IMPLANT
MANIFOLD NEPTUNE II (INSTRUMENTS) ×2 IMPLANT
NS IRRIG 500ML POUR BTL (IV SOLUTION) ×2 IMPLANT
PACK CYSTO (CUSTOM PROCEDURE TRAY) ×2 IMPLANT
SLEEVE SCD COMPRESS KNEE MED (STOCKING) ×2 IMPLANT
SYR 30ML LL (SYRINGE) IMPLANT
SYR TOOMEY IRRIG 70ML (MISCELLANEOUS) ×2
SYRINGE TOOMEY IRRIG 70ML (MISCELLANEOUS) IMPLANT
TRACTIP FLEXIVA PULS ID 200XHI (Laser) IMPLANT
TRACTIP FLEXIVA PULSE ID 200 (Laser)
TUBE CONNECTING 12X1/4 (SUCTIONS) IMPLANT
TUBING UROLOGY SET (TUBING) ×2 IMPLANT
WATER STERILE IRR 500ML POUR (IV SOLUTION) ×2 IMPLANT

## 2023-05-25 NOTE — Progress Notes (Signed)
Has verbalized a lot of urgency post op, attempted to void into urinal on several attempts, once did pass some urine of less than 5ml, Bladder scan performed x 3 (0ml, 1ml, 0ml), Client does not verbalize having spasms, pain or discomfort. Suprapubic area very soft upon manual palpation as well. Client tolerated palpation and bladder scan without issues. MD paged, discussed the current issue. Order rec to place 54fr urinary catheter and client to follow up in office at [facility] on Monday. Family updated of plan of care and conversation with MD.

## 2023-05-25 NOTE — Progress Notes (Signed)
In to explain to client the order and indication for urinary catheter. Client refused urinary catheter, despite the indications for and discussion had with his primary MD. Client requested to drink more water and opportunity for void into urinal. After taking in more POs, client was able to void of bloody colored urine into urinal without difficulty. Urinary catheter was not placed per pt refusal and being able to void into urinal. Clients MD was informed. AVS was refused with daughter and explained when the need would arise for him to report  to the [facility] Emergency Dept (Fever, unable to void etc).

## 2023-05-25 NOTE — Discharge Instructions (Signed)
 Transurethral Resection of Bladder Tumor (TURBT) or Bladder Biopsy ° ° °Definition: ° Transurethral Resection of the Bladder Tumor is a surgical procedure used to diagnose and remove tumors within the bladder. TURBT is the most common treatment for early stage bladder cancer. ° °General instructions: °   ° Your recent bladder surgery requires very little post hospital care but some definite precautions. ° °Despite the fact that no skin incisions were used, the area around the bladder incisions are raw and covered with scabs to promote healing and prevent bleeding. Certain precautions are needed to insure that the scabs are not disturbed over the next 2-4 weeks while the healing proceeds. ° °Because the raw surface inside your bladder and the irritating effects of urine you may expect frequency of urination and/or urgency (a stronger desire to urinate) and perhaps even getting up at night more often. This will usually resolve or improve slowly over the healing period. You may see some blood in your urine over the first 6 weeks. Do not be alarmed, even if the urine was clear for a while. Get off your feet and drink lots of fluids until clearing occurs. If you start to pass clots or don't improve call us. ° °Diet: ° °You may return to your normal diet immediately. Because of the raw surface of your bladder, alcohol, spicy foods, foods high in acid and drinks with caffeine may cause irritation or frequency and should be used in moderation. To keep your urine flowing freely and avoid constipation, drink plenty of fluids during the day (8-10 glasses). Tip: Avoid cranberry juice because it is very acidic. ° °Activity: ° °Your physical activity doesn't need to be restricted. However, if you are very active, you may see some blood in the urine. We suggest that you reduce your activity under the circumstances until the bleeding has stopped. ° °Bowels: ° °It is important to keep your bowels regular during the postoperative  period. Straining with bowel movements can cause bleeding. A bowel movement every other day is reasonable. Use a mild laxative if needed, such as milk of magnesia 2-3 tablespoons, or 2 Dulcolax tablets. Call if you continue to have problems. If you had been taking narcotics for pain, before, during or after your surgery, you may be constipated. Take a laxative if necessary. ° ° ° °Medication: ° °You should resume your pre-surgery medications unless told not to. In addition you may be given an antibiotic to prevent or treat infection. Antibiotics are not always necessary. All medication should be taken as prescribed until the bottles are finished unless you are having an unusual reaction to one of the drugs. ° ° ° ° ° °

## 2023-05-25 NOTE — Anesthesia Postprocedure Evaluation (Signed)
Anesthesia Post Note  Patient: Nathaniel Watson  Procedure(s) Performed: TRANSURETHRAL RESECTION OF BLADDER TUMOR (TURBT) (Bladder) CYSTOSCOPY WITH RETROGRADE PYELOGRAM (Bilateral: Renal)     Patient location during evaluation: PACU Anesthesia Type: General Level of consciousness: awake and alert Pain management: pain level controlled Vital Signs Assessment: post-procedure vital signs reviewed and stable Respiratory status: spontaneous breathing, nonlabored ventilation, respiratory function stable and patient connected to nasal cannula oxygen Cardiovascular status: blood pressure returned to baseline and stable Postop Assessment: no apparent nausea or vomiting Anesthetic complications: no   No notable events documented.  Last Vitals:  Vitals:   05/25/23 1045 05/25/23 1115  BP: 122/86 118/85  Pulse:  70  Resp:  16  Temp:    SpO2: 95% 94%    Last Pain:  Vitals:   05/25/23 1115  TempSrc:   PainSc: 0-No pain                 Trevor Iha

## 2023-05-25 NOTE — Interval H&P Note (Signed)
History and Physical Interval Note:  05/25/2023 9:10 AM  Nathaniel Watson  has presented today for surgery, with the diagnosis of BLADDER CANCER.  The various methods of treatment have been discussed with the patient and family. After consideration of risks, benefits and other options for treatment, the patient has consented to  Procedure(s): TRANSURETHRAL RESECTION OF BLADDER TUMOR (TURBT) (N/A) CYSTOSCOPY WITH RETROGRADE PYELOGRAM (Bilateral) as a surgical intervention.  The patient's history has been reviewed, patient examined, no change in status, stable for surgery.  I have reviewed the patient's chart and labs.  Questions were answered to the patient's satisfaction.     [name]

## 2023-05-25 NOTE — H&P (Signed)
89 year old male with a history of prostate cancer s/p radiation in 2008 with undetectable PSAs presented today for further discussion of gross hematuria.   The patient's symptoms developed about 3-1/2 months ago. He has intermittent gross hematuria. He denies any clots. He states that sometimes it is worse than others, but for the most part he has had no clots. He has no dysuria. He has no associated pain. He voids twice a day and sometimes at night. He has a normal stream. He feels like he empties his bladder well. He had a negative urine culture at his primary care doctor's office.   The patient does have a history of kidney stones, but has not been treated for them in 15 or 20 years. He does not have a history of recurrent hematuria prior to this.     ALLERGIES: No Allergies    MEDICATIONS: Aspirin 81 MG TABS Oral  Fish Oil 120 mg-180 mg capsule Oral  Losartan-Hydrochlorothiazide 50 mg-12.5 mg tablet Oral  Multi-Day Vitamins TABS Oral  Omega-3 CAPS Oral  Vitamin B-12 TABS Oral     GU PSH: TRANSPERI NEEDLE PLACE, PROS - 2012       PSH Notes: Tonsillectomy, Surgery Prostate Transperineal Placement Of Needles, Leg Repair   NON-GU PSH: Remove Tonsils - 2012     GU PMH: Bladder-neck stenosis/contracture, Bladder neck contracture - 2014 BPH w/LUTS, Benign Prostatic Hypertrophy With Urinary Obstruction - 2014 ED due to arterial insufficiency, Erectile dysfunction due to arterial insufficiency - 2014 Elevated PSA, Elevated prostate specific antigen (PSA) - 2014 History of urolithiasis, Nephrolithiasis - 2014 Prostate Cancer, Prostate cancer - 2014 Renal calculus, Nephrolithiasis - 2014      PMH Notes:  1898-11-08 00:00:00 - Note: Normal Routine History And Physical Senior Citizen [phone])  2006-12-19 11:18:50 - Note: Arthritis   NON-GU PMH: Personal history of other diseases of the circulatory system, History of hypertension - 2014, History of hypertension, - 2014 Personal  history of other endocrine, nutritional and metabolic disease, History of hypercholesterolemia - 2014 GERD Hypertension    FAMILY HISTORY: 2 daughters - Daughter 1 son - Son Death In The Family Father - Runs In Family Death In The Family Mother - Runs In Family Family Health Status Number - Runs In Family Prostate Cancer - Brother stroke - Father   SOCIAL HISTORY: Marital Status: Widowed Preferred Language: English; Ethnicity: Not Hispanic Or Latino; Race: White Current Smoking Status: Patient has never smoked.   Tobacco Use Assessment Completed: Used Tobacco in last 30 days? Has never drank.  Drinks 1 caffeinated drink per day. Patient's occupation is/was Retired.     Notes: Never A Smoker, Tobacco Use, Marital History - Widowed, Retired From Work, Alcohol Use, Caffeine Use   REVIEW OF SYSTEMS:    GU Review Male:   Patient reports hard to postpone urination and erection problems. Patient denies frequent urination, burning/ pain with urination, get up at night to urinate, leakage of urine, stream starts and stops, trouble starting your stream, have to strain to urinate , and penile pain.  Gastrointestinal (Upper):   Patient reports indigestion/ heartburn. Patient denies nausea and vomiting.  Gastrointestinal (Lower):   Patient denies diarrhea and constipation.  Constitutional:   Patient denies fever, night sweats, weight loss, and fatigue.  Skin:   Patient denies skin rash/ lesion and itching.  Eyes:   Patient denies blurred vision and double vision.  Ears/ Nose/ Throat:   Patient denies sore throat and sinus problems.  Hematologic/Lymphatic:   Patient denies  swollen glands and easy bruising.  Cardiovascular:   Patient denies leg swelling and chest pains.  Respiratory:   Patient denies cough and shortness of breath.  Endocrine:   Patient denies excessive thirst.  Musculoskeletal:   Patient reports joint pain. Patient denies back pain.  Neurological:   Patient denies headaches and  dizziness.  Psychologic:   Patient denies anxiety and depression.   VITAL SIGNS:      05/03/2023 01:32 PM  Weight 205 lb / 92.99 kg  BP 123/54 mmHg  Pulse 80 /min   MULTI-SYSTEM PHYSICAL EXAMINATION:    Constitutional: Well-nourished. No physical deformities. Normally developed. Good grooming.  Neck: Neck symmetrical, not swollen. Normal tracheal position.  Respiratory: Normal breath sounds. No labored breathing, no use of accessory muscles.   Cardiovascular: Regular rate and rhythm. No murmur, no gallop. Normal temperature, normal extremity pulses, no swelling, no varicosities.   Lymphatic: No enlargement of neck, axillae, groin.  Skin: No paleness, no jaundice, no cyanosis. No lesion, no ulcer, no rash.  Neurologic / Psychiatric: Oriented to time, oriented to place, oriented to person. No depression, no anxiety, no agitation.  Gastrointestinal: No mass, no tenderness, no rigidity, non obese abdomen.  Eyes: Normal conjunctivae. Normal eyelids.  Ears, Nose, Mouth, and Throat: Left ear no scars, no lesions, no masses. Right ear no scars, no lesions, no masses. Nose no scars, no lesions, no masses. Normal hearing. Normal lips.  Musculoskeletal: Normal gait and station of head and neck.     Complexity of Data:  Source Of History:  Patient  Lab Test Review:   PSA  Records Review:   Pathology Reports, Previous Doctor Records, Previous Patient Records, POC Tool  Urine Test Review:   Urinalysis   10/09/11 04/06/11 04/24/10 07/20/07 12/13/06 06/23/06 03/22/03  PSA  Total PSA 0.62  1.06  11.00  5.66  4.95  4.71  3.65   Free PSA    1.88  1.80  1.60    % Free PSA    33.2  36.4  34.0      PROCEDURES:         Flexible Cystoscopy - 52000  Risks, benefits, and some of the potential complications of the procedure were discussed at length with the patient including infection, bleeding, voiding discomfort, urinary retention, fever, chills, sepsis, and others. All questions were answered. Informed  consent was obtained. Sterile technique and intraurethral analgesia were used.  Meatus:  Normal size. Normal location. Normal condition.  Urethra:  No strictures.  External Sphincter:  Normal.  Verumontanum:  Normal.  Prostate:  Non-obstructing. No hyperplasia.  Bladder Neck:  Non-obstructing.  Ureteral Orifices:  Normal location. Normal size. Normal shape. Effluxed clear urine.  Bladder:  A left lateral wall tumor. 2 cm tumor. No trabeculation. Normal mucosa. No stones.      The lower urinary tract was carefully examined. The procedure was well-tolerated and without complications. Antibiotic instructions were given. Instructions were given to call the office immediately for bloody urine, difficulty urinating, urinary retention, painful or frequent urination, fever, chills, nausea, vomiting or other illness. The patient stated that he understood these instructions and would comply with them.         Urinalysis w/Scope Dipstick Dipstick Cont'd Micro  Color: Brown Bilirubin: Invalid mg/dL WBC/hpf: 10 - 86/VHQ  Appearance: Cloudy Ketones: Invalid mg/dL RBC/hpf: >46/NGE  Specific Gravity: Invalid Blood: Invalid ery/uL Bacteria: Rare (0-9/hpf)  pH: Invalid Protein: Invalid mg/dL Cystals: NS (Not Seen)  Glucose: Invalid mg/dL Urobilinogen: Invalid mg/dL Casts:  NS (Not Seen)    Nitrites: Invalid Trichomonas: Not Present    Leukocyte Esterase: Invalid leu/uL Mucous: Not Present      Epithelial Cells: NS (Not Seen)      Yeast: NS (Not Seen)      Sperm: Not Present    Notes: dip invalid due to color interference and cellular turbidity    ASSESSMENT:      ICD-10 Details  1 GU:   Bladder Cancer Lateral - 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 I explained to the patient the findings. He and his daughter were  able to see the tumor on the cystoscopic evaluation. I recommended that we proceed to the operating room for transurethral resection of his bladder tumor as well as bilateral retrograde pyelogram. I explained the surgery in detail including the risk and the benefits as well as expected outcome.   The patient has some limited mobility, but otherwise appears to be quite healthy. We will quickly reach out to his primary care doctor to ensure he does not have any reservations about putting this 89 year old gentleman sleep.

## 2023-05-25 NOTE — Anesthesia Procedure Notes (Signed)
Procedure Name: Intubation Date/Time: 05/25/2023 9:39 AM  Performed by: Dairl Ponder, CRNAPre-anesthesia Checklist: Patient identified, Emergency Drugs available, Suction available and Patient being monitored Patient Re-evaluated:Patient Re-evaluated prior to induction Oxygen Delivery Method: Circle System Utilized Preoxygenation: Pre-oxygenation with 100% oxygen Induction Type: IV induction Ventilation: Mask ventilation without difficulty Laryngoscope Size: Mac and 4 Grade View: Grade I Tube type: Oral Tube size: 7.5 mm Number of attempts: 1 Airway Equipment and Method: Stylet and Oral airway Placement Confirmation: ETT inserted through vocal cords under direct vision, positive ETCO2 and breath sounds checked- equal and bilateral Secured at: 24 cm Tube secured with: Tape Dental Injury: Teeth and Oropharynx as per pre-operative assessment

## 2023-05-25 NOTE — Transfer of Care (Signed)
Immediate Anesthesia Transfer of Care Note  Patient: Nathaniel Watson  Procedure(s) Performed: TRANSURETHRAL RESECTION OF BLADDER TUMOR (TURBT) (Bladder) CYSTOSCOPY WITH RETROGRADE PYELOGRAM (Bilateral: Renal)  Patient Location: PACU  Anesthesia Type:General  Level of Consciousness: drowsy and patient cooperative  Airway & Oxygen Therapy: Patient Spontanous Breathing  Post-op Assessment: Report given to RN and Post -op Vital signs reviewed and stable  Post vital signs: Reviewed and stable  Last Vitals:  Vitals Value Taken Time  BP    Temp    Pulse    Resp    SpO2      Last Pain:  Vitals:   05/25/23 0828  TempSrc: Oral  PainSc: 0-No pain         Complications: No notable events documented.

## 2023-05-25 NOTE — Op Note (Signed)
Preoperative diagnosis:  Bladder cancer left lateral wall, 2 cm  Postoperative diagnosis:  Same  Procedure: Cystoscopy, bilateral retrograde pyelogram with interpretation Transurethral resection of bladder tumor, 2 cm  Surgeon: [name], MD  Anesthesia: General  Complications: None  Intraoperative findings:  #1: The patient's right retrograde pyelogram demonstrated normal caliber ureter with no filling defects or abnormalities.  There is no hydroureteronephrosis and the calyces were sharp. #2: The patient's left retrograde pyelogram demonstrated normal caliber ureter with no filling defects or abnormalities.  There is no hydroureteronephrosis with sharp calyces. #3: The patient's tumor was on the left lateral sidewall.  There was necrotic tissue as well as some overlying calcifications.  The tumor did appear to be high-grade.  EBL: Minimal  Specimens:  #1: Left lateral wall bladder tumor #2: Bladder tumor base  Indication: Nathaniel Watson is a 89 y.o. patient with small bladder tumor as identified on cystoscopy performed for gross hematuria.  After reviewing the management options for treatment, he elected to proceed with the above surgical procedure(s). We have discussed the potential benefits and risks of the procedure, side effects of the proposed treatment, the likelihood of the patient achieving the goals of the procedure, and any potential problems that might occur during the procedure or recuperation. Informed consent has been obtained.  Description of procedure:  Consent was obtained the preoperative holding area.  He was then brought back to the operating room placed on the table in supine position.  General anesthesia was then induced endotracheal tube was inserted.  He was placed in the dorsolithotomy position and prepped and draped in the routine sterile fashion.  A timeout was then performed.  21 French 30 degree cystoscope was gently passed to the patient's  urethra into the bladder under visual guidance.  Lots of clot was noted within the bladder as well as murky urine, and this was all irrigated out.  Approximately 100 cc of clot was evacuated.  Once the bladder was fully evacuated cystoscopy was performed in 3 and 6 degree fashion with the above findings.  There were no additional tumors or lesions.  Using a 5 [place] open-ended ureteral catheter bilateral retrograde pyelograms were performed with the above findings.  I then removed the 21 French cystoscope and exchanged it for the 26 French resectoscope sheath.  This was passed and with the visual obturator and exchanged for the loop element device.  I then systematically performed a TURBT of the tumor.  I remove the bladder tumor specimen and then we resected the bladder base and sent that as a separate specimen.  I fulgurated the area copiously.  Reinspected the area as well as the bladder.  There was no additional bleeding.  The bladder was subsequently emptied and the scope was removed.  He was subsequently extubated and returned to PACU in stable condition.  Disposition: The patient be scheduled for follow-up in 2 weeks.  He is being discharged home in the care of his daughter.

## 2023-05-26 LAB — SURGICAL PATHOLOGY

## 2023-05-30 ENCOUNTER — Encounter (HOSPITAL_BASED_OUTPATIENT_CLINIC_OR_DEPARTMENT_OTHER): Payer: Self-pay | Admitting: Urology

## 2023-07-29 IMAGING — CT CT L SPINE W/O CM
3 series · 9 of 33 positions shown, 11 images · non-contrast
Comparison: Nuclear medicine bone scan 06/10/2010.

CLINICAL DATA: Provided history: Back trauma, no prior imaging.
Additional history provided: Fall 5 days ago. Low back pain.



[Series 4: l spine st · axial · 0.28mm/px · z∈[+1322,+1322]mm · 1 of 119 slices shown, 2 images]
[im 64/119  soft-tissue]
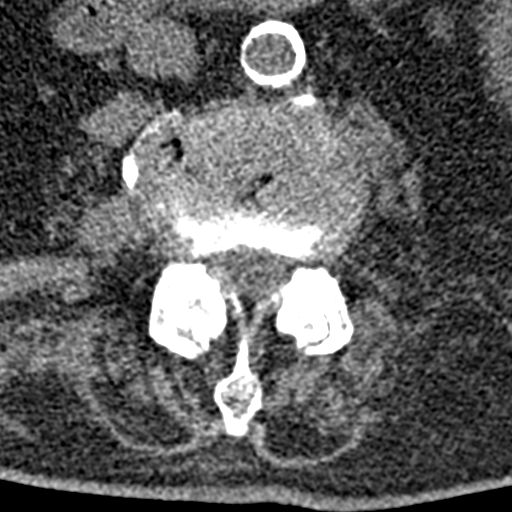
[im 64/119  bone]
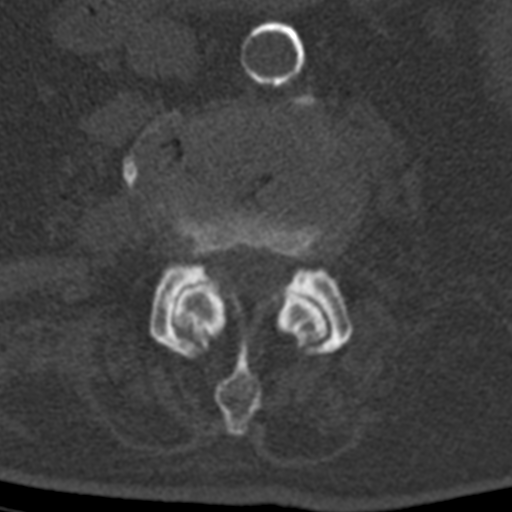

[Series 8: coronal bone · coronal · 0.26mm/px · 3 of 77 slices shown]
[im 16/77  bone]
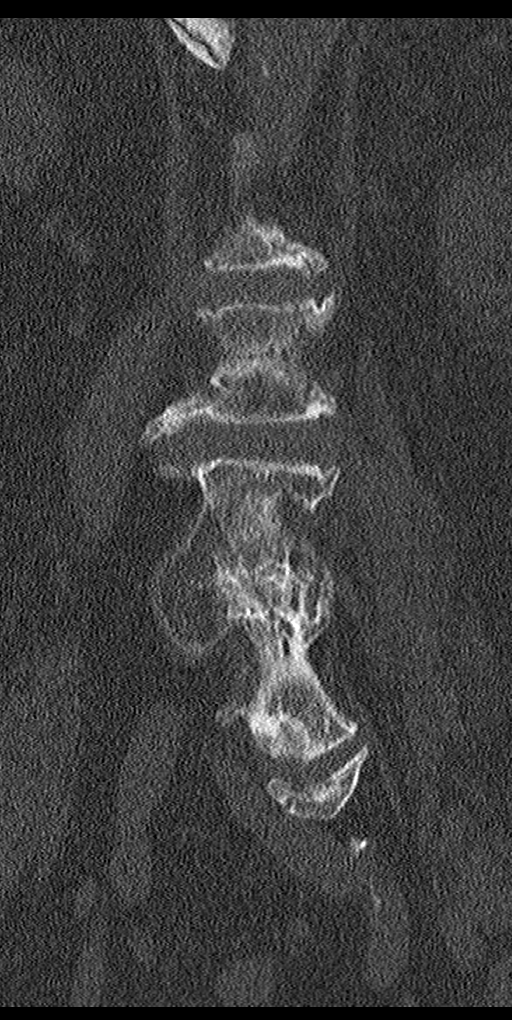
[im 31/77  bone]
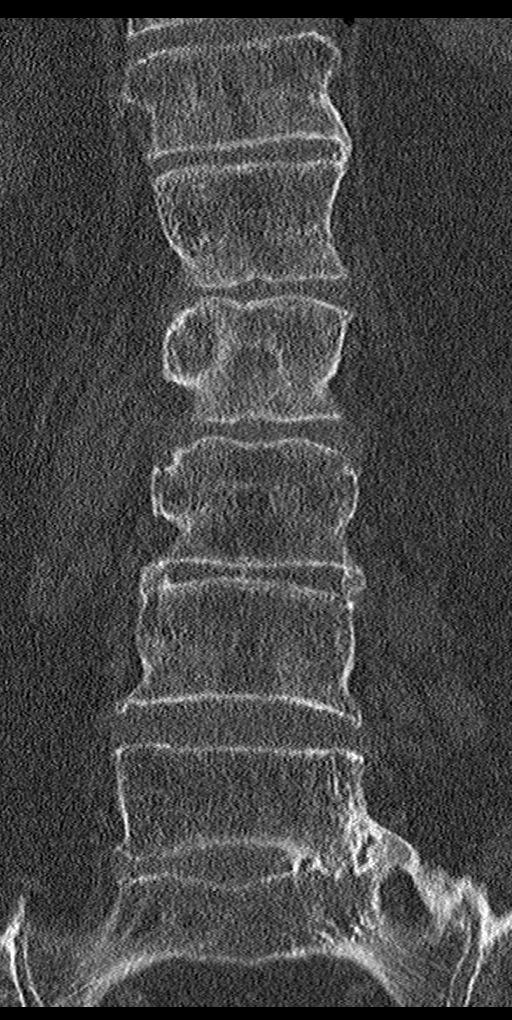
[im 46/77  bone]
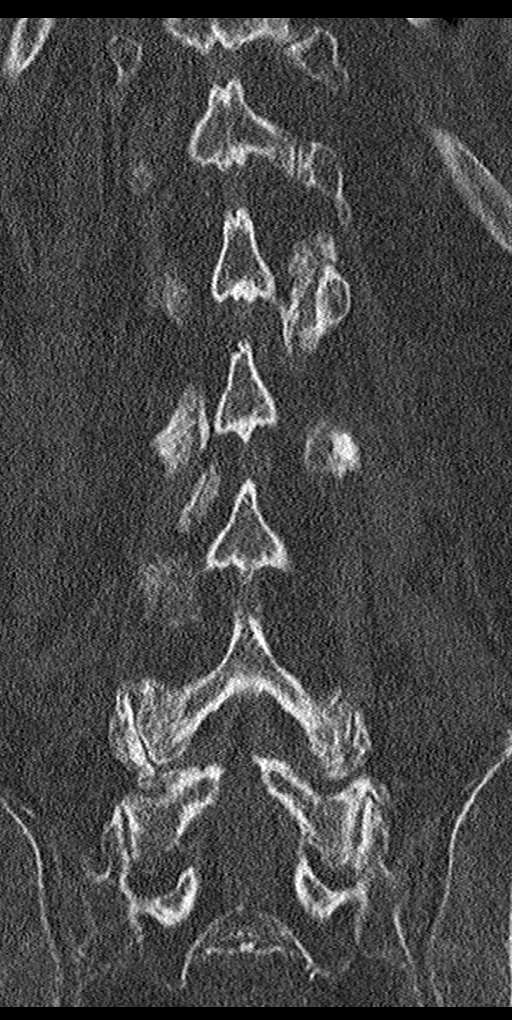

[Series 9: sagittal bone · sagittal · 0.30mm/px · 5 of 67 slices shown, 6 images]
[im 23/67  bone]
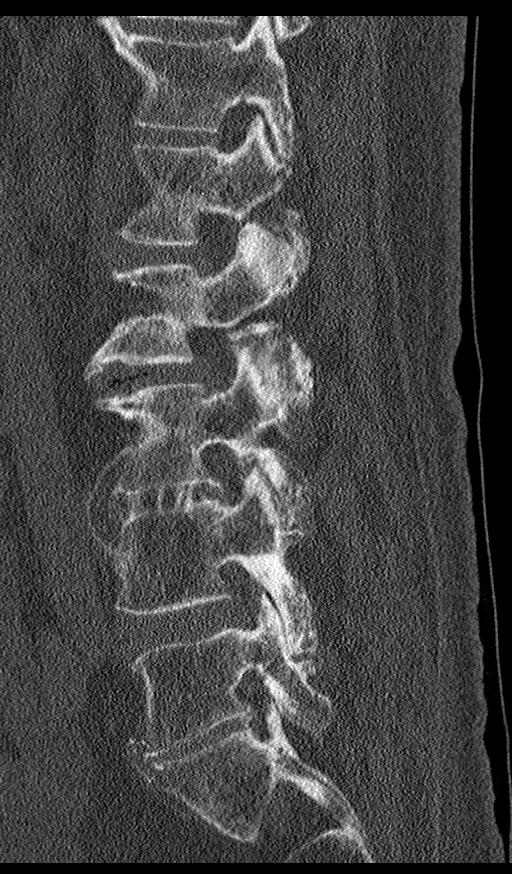
[im 28/67  bone]
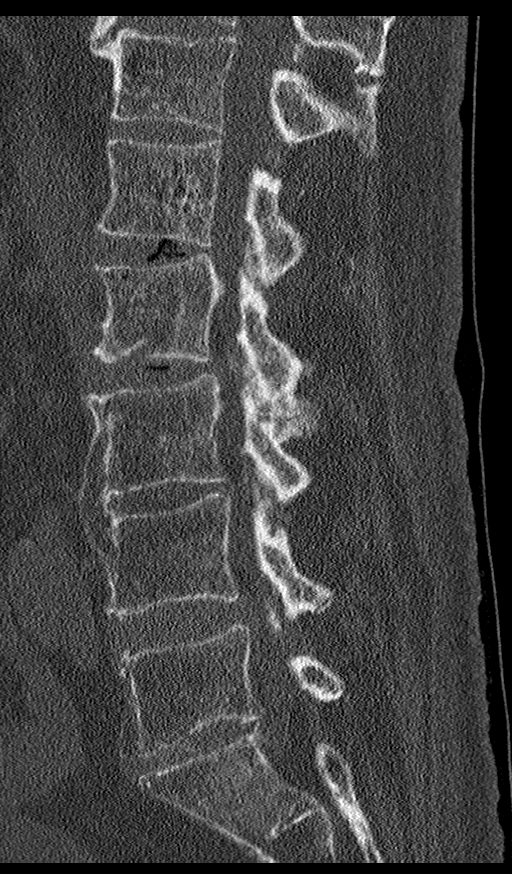
[im 34/67  soft-tissue]
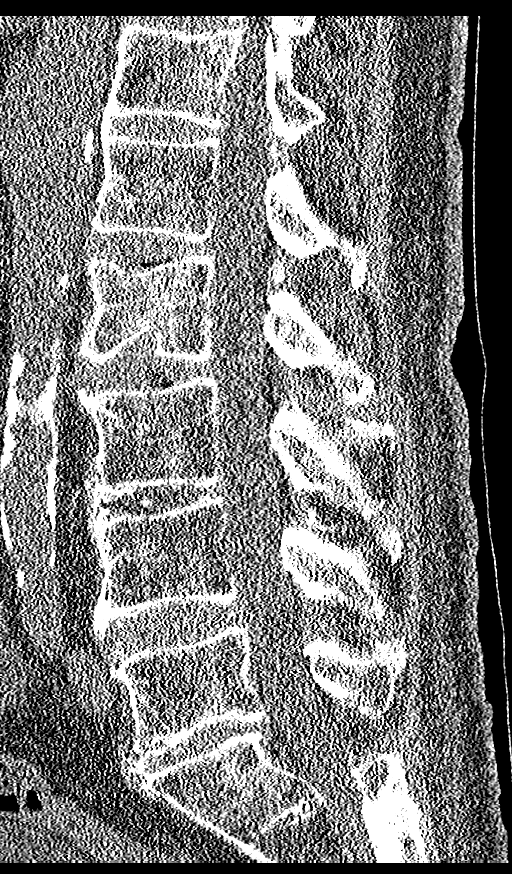
[im 34/67  bone]
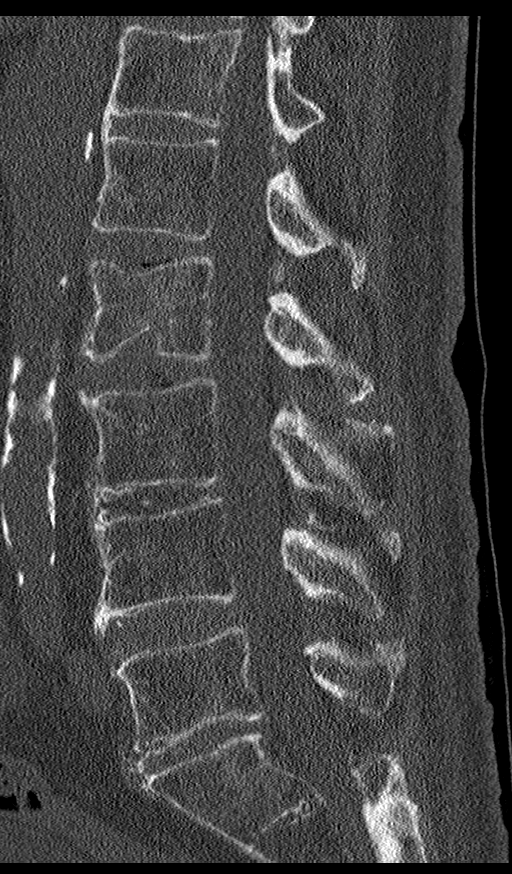
[im 39/67  bone]
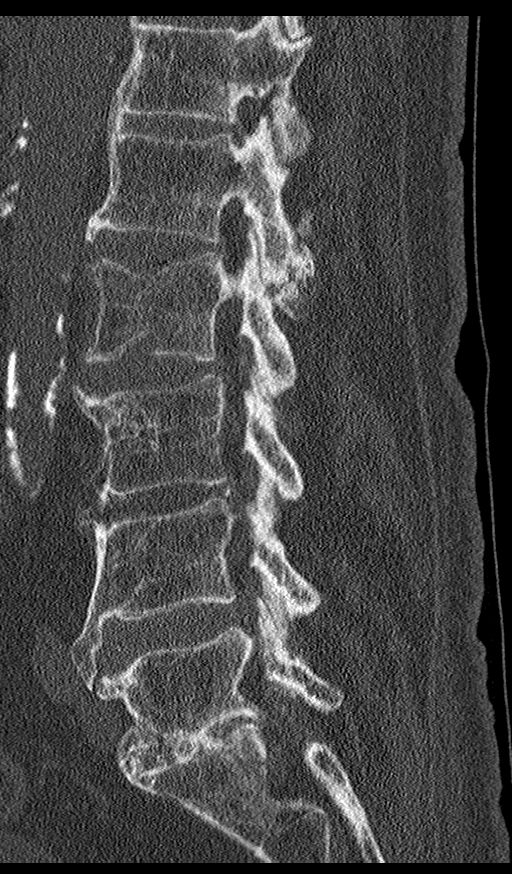
[im 45/67  bone]
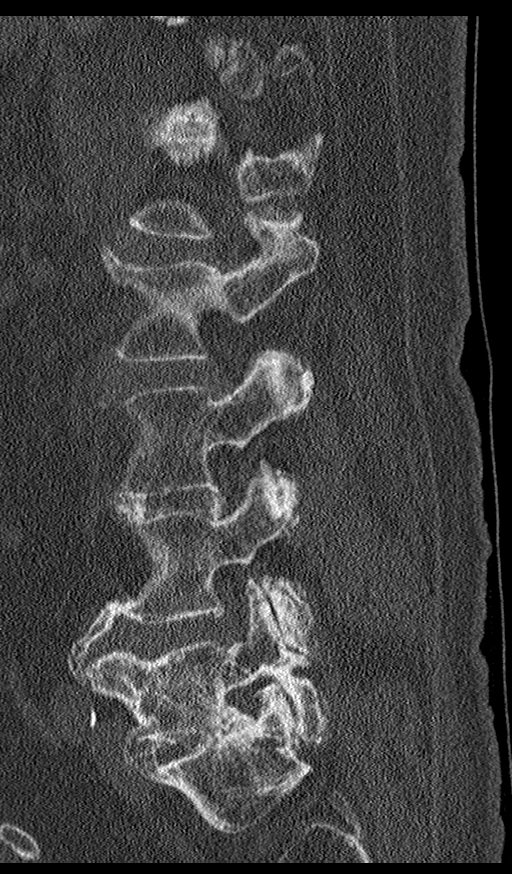

[9 of 33 positions shown; findings below may reference images not displayed]

FINDINGS: Segmentation: 5 lumbar vertebrae. The caudal most well-formed
intervertebral disc space is designated L5-S1.

Alignment: Lumbar levocurvature. 4 mm fused grade 1 retrolisthesis
at L5-S1.

Vertebrae: Age-indeterminate compression deformities of the L2
superior and inferior endplates (up to 40-50% height loss) (for
instance as seen on series 9, image 38). Vertebral body height is
otherwise maintained. No evidence of acute fracture to the lumbar
spine elsewhere. Multilevel ventrolateral osteophytes, some
bridging.

Paraspinal and other soft tissues: No acute finding within included
portions of the abdomen/retroperitoneum. Aortoiliac atherosclerosis.
No paraspinal mass or collection.

Disc levels:

Advanced disc space narrowing on the left at L5-S1 with fusion
across the anterior and left aspect of the disc space at this level.
No more than mild disc space narrowing at the remaining lumbar
levels. Osseous fusion across the disc space bilaterally at L3-L4.

T12-L1: Mild facet arthrosis. No appreciable significant spinal
canal or foraminal stenosis.

L1-L2: Disc bulge. Moderate facet arthrosis. No appreciable
significant spinal canal stenosis. Mild-to-moderate bilateral neural
foraminal narrowing.

L2-L3: Disc bulge. Facet arthrosis (advanced right, moderate left)
with ligamentum flavum hypertrophy. No significant central canal
stenosis is appreciated. Mild-to-moderate bilateral neural foraminal
narrowing.

L3-L4: Disc bulge. Osseous fusion across the disc space bilaterally.
Facet arthrosis (moderate right, mild left). No significant central
canal stenosis is appreciated. Mild bilateral neural foraminal
narrowing.

L4-L5: Disc bulge. Facet arthrosis (moderate right, mild left).
Bilateral subarticular narrowing. Apparent mild narrowing of the
central canal. Moderate bilateral neural foraminal narrowing.

L5-S1: Disc bulge. Fusion across the left aspect of the disc space.
Mild to moderate facet arthrosis no significant spinal canal
stenosis. Bilateral neural foraminal narrowing (mild right, moderate
left).
IMPRESSION: Age-indeterminate compression deformities of the L2 superior and
inferior endplates (up to 40-50% height loss). Correlate for point
tenderness at this site. An MRI of the lumbar spine may be obtained
for further evaluation, as warranted. No significant bony
retropulsion.

Lumbar spondylosis, as outlined. No more than mild central canal
stenosis is appreciated. Multifactorial bilateral subarticular
stenosis at L3-L4. Multilevel neural foraminal narrowing, as
detailed and greatest bilaterally at L4-L5, and on the left at L5-S1
(moderate at these sites). Osseous fusion across the left aspect of
the disc space at L5-S1. Osseous fusion across the disc space also
present at L3-L4.

Lumbar levocurvature.

4 mm L5-S1 fused grade 1 retrolisthesis.

## 2024-09-11 ENCOUNTER — Other Ambulatory Visit: Payer: Self-pay | Admitting: Urology

## 2024-09-12 ENCOUNTER — Encounter (HOSPITAL_COMMUNITY): Payer: Self-pay | Admitting: Urology

## 2024-09-12 NOTE — Progress Notes (Signed)
 Spoke w/ via phone for pre-op interview---pt daughter/HCPOA Nathaniel Watson  Lab needs dos---- BMP and EKG per anesthesia        Lab results------ COVID test -----patient states asymptomatic no test needed Arrive at -------1045 NPO after MN NO Solid Food.  Clear liquids from MN until---0945  Med rec completed Medications to take morning of surgery -----NONE Diabetic medication -----  GLP1 agonist last dose: GLP1 instructions:  Patient instructed no nail polish to be worn day of surgery Patient instructed to bring photo id and insurance card day of surgery Patient aware to have Driver (ride ) / caregiver    for 24 hours after surgery - Daughter Nathaniel Watson Patient Special Instructions ----- Pre-Op special Instructions ----- pt is HOH even with hearing aids daughter will need to come to preop with pt. Pt in wheelchair, will need help to stand and pivot per daughter she can help him.  Patient verbalized understanding of instructions that were given at this phone interview. Patient denies chest pain, sob, fever, cough at the interview.

## 2024-09-13 MED ORDER — GEMCITABINE CHEMO FOR BLADDER INSTILLATION 2000 MG
2000.0000 mg | INTRAVENOUS | Status: DC
  Filled 2024-09-13 (×3): qty 52.6

## 2024-09-14 ENCOUNTER — Encounter (HOSPITAL_COMMUNITY)
Admission: RE | Disposition: A | Discharge status: Home / Self Care | Payer: Self-pay | Source: Home / Self Care | Attending: Urology

## 2024-09-14 ENCOUNTER — Ambulatory Visit (HOSPITAL_COMMUNITY): Admitting: Anesthesiology

## 2024-09-14 ENCOUNTER — Ambulatory Visit (HOSPITAL_COMMUNITY)
Admission: RE | Admit: 2024-09-14 | Discharge: 2024-09-14 | Disposition: A | Discharge status: Home / Self Care | Attending: Urology | Admitting: Urology

## 2024-09-14 ENCOUNTER — Ambulatory Visit (HOSPITAL_COMMUNITY)

## 2024-09-14 DIAGNOSIS — C679 Malignant neoplasm of bladder, unspecified: Secondary | ICD-10-CM | POA: Diagnosis not present

## 2024-09-14 DIAGNOSIS — I10 Essential (primary) hypertension: Secondary | ICD-10-CM | POA: Diagnosis not present

## 2024-09-14 DIAGNOSIS — Z8551 Personal history of malignant neoplasm of bladder: Secondary | ICD-10-CM | POA: Diagnosis present

## 2024-09-14 DIAGNOSIS — Z79899 Other long term (current) drug therapy: Secondary | ICD-10-CM | POA: Diagnosis not present

## 2024-09-14 DIAGNOSIS — D090 Carcinoma in situ of bladder: Secondary | ICD-10-CM | POA: Insufficient documentation

## 2024-09-14 HISTORY — PX: CYSTOSCOPY W/ RETROGRADES: SHX1426

## 2024-09-14 LAB — BASIC METABOLIC PANEL WITH GFR
Anion gap: 12 (ref 5–15)
BUN: 34 mg/dL — ABNORMAL HIGH (ref 8–23)
CO2: 27 mmol/L (ref 22–32)
Calcium: 8.6 mg/dL — ABNORMAL LOW (ref 8.9–10.3)
Chloride: 104 mmol/L (ref 98–111)
Creatinine, Ser: 1.25 mg/dL — ABNORMAL HIGH (ref 0.61–1.24)
GFR, Estimated: 55 mL/min — ABNORMAL LOW (ref >60)
Glucose, Bld: 93 mg/dL (ref 70–99)
Potassium: 4 mmol/L (ref 3.5–5.1)
Sodium: 143 mmol/L (ref 135–145)

## 2024-09-14 SURGERY — TURBT, WITH CHEMOTHERAPEUTIC AGENT INSTILLATION INTO BLADDER
Anesthesia: General | Site: Bladder

## 2024-09-14 MED ORDER — ONDANSETRON HCL 4 MG/2ML IJ SOLN
INTRAMUSCULAR | Status: DC | PRN
  Administered 2024-09-14: 4 mg via INTRAVENOUS

## 2024-09-14 MED ORDER — CHLORHEXIDINE GLUCONATE 0.12 % MT SOLN
15.0000 mL | Freq: Once | OROMUCOSAL | Status: AC
  Administered 2024-09-14: 15 mL via OROMUCOSAL

## 2024-09-14 MED ORDER — LIDOCAINE 2% (20 MG/ML) 5 ML SYRINGE
INTRAMUSCULAR | Status: DC | PRN
  Administered 2024-09-14: 100 mg via INTRAVENOUS

## 2024-09-14 MED ORDER — PROPOFOL 10 MG/ML IV BOLUS
INTRAVENOUS | Status: DC | PRN
  Administered 2024-09-14: 80 mg via INTRAVENOUS

## 2024-09-14 MED ORDER — CIPROFLOXACIN IN D5W 400 MG/200ML IV SOLN
INTRAVENOUS | Status: AC
  Filled 2024-09-14: qty 200

## 2024-09-14 MED ORDER — CHLORHEXIDINE GLUCONATE 0.12 % MT SOLN
OROMUCOSAL | Status: DC
Start: 2024-09-14 — End: 2024-09-14
  Filled 2024-09-14: qty 15

## 2024-09-14 MED ORDER — LACTATED RINGERS IV SOLN: INTRAVENOUS | Status: DC

## 2024-09-14 MED ORDER — FENTANYL CITRATE (PF) 100 MCG/2ML IJ SOLN
INTRAMUSCULAR | Status: AC
  Filled 2024-09-14: qty 2

## 2024-09-14 MED ORDER — CIPROFLOXACIN IN D5W 400 MG/200ML IV SOLN
400.0000 mg | INTRAVENOUS | Status: AC
  Administered 2024-09-14: 400 mg via INTRAVENOUS

## 2024-09-14 MED ORDER — IOHEXOL 300 MG/ML  SOLN
INTRAMUSCULAR | Status: DC | PRN
  Administered 2024-09-14: 7 mL

## 2024-09-14 MED ORDER — ONDANSETRON HCL 4 MG/2ML IJ SOLN: 4.0000 mg | Freq: Once | INTRAMUSCULAR | Status: DC | PRN

## 2024-09-14 MED ORDER — ACETAMINOPHEN 500 MG PO TABS
ORAL_TABLET | ORAL | Status: AC
  Filled 2024-09-14: qty 2

## 2024-09-14 MED ORDER — FENTANYL CITRATE (PF) 250 MCG/5ML IJ SOLN
INTRAMUSCULAR | Status: DC | PRN
  Administered 2024-09-14 (×2): 50 ug via INTRAVENOUS

## 2024-09-14 MED ORDER — LIDOCAINE 2% (20 MG/ML) 5 ML SYRINGE
INTRAMUSCULAR | Status: AC
  Filled 2024-09-14: qty 5

## 2024-09-14 MED ORDER — ACETAMINOPHEN 500 MG PO TABS
1000.0000 mg | ORAL_TABLET | Freq: Once | ORAL | Status: AC
  Administered 2024-09-14: 1000 mg via ORAL

## 2024-09-14 MED ORDER — GEMCITABINE CHEMO FOR BLADDER INSTILLATION 2000 MG: 2000.0000 mg | Freq: Once | INTRAVENOUS | Status: AC

## 2024-09-14 MED ORDER — ONDANSETRON HCL 4 MG/2ML IJ SOLN
INTRAMUSCULAR | Status: AC
Start: 2024-09-14 — End: 2024-09-14
  Filled 2024-09-14: qty 2

## 2024-09-14 MED ORDER — MIDAZOLAM HCL 2 MG/2ML IJ SOLN
INTRAMUSCULAR | Status: AC
Start: 2024-09-14 — End: 2024-09-14
  Filled 2024-09-14: qty 2

## 2024-09-14 MED ORDER — ORAL CARE MOUTH RINSE: 15.0000 mL | Freq: Once | OROMUCOSAL | Status: AC

## 2024-09-14 MED ORDER — GEMCITABINE CHEMO FOR BLADDER INSTILLATION 2000 MG
INTRAVENOUS | Status: DC | PRN
  Administered 2024-09-14: 2000 mg via INTRAVESICAL

## 2024-09-14 MED ORDER — DEXAMETHASONE SOD PHOSPHATE PF 10 MG/ML IJ SOLN
INTRAMUSCULAR | Status: DC | PRN
  Administered 2024-09-14: 10 mg via INTRAVENOUS

## 2024-09-14 MED ORDER — STERILE WATER FOR IRRIGATION IR SOLN
Status: DC | PRN
  Administered 2024-09-14 (×2): 3000 mL

## 2024-09-14 MED ORDER — PROPOFOL 10 MG/ML IV BOLUS
INTRAVENOUS | Status: AC
  Filled 2024-09-14: qty 20

## 2024-09-14 MED ORDER — TRAMADOL HCL 50 MG PO TABS: 50.0000 mg | ORAL_TABLET | Freq: Four times a day (QID) | ORAL | 0 refills | Status: AC | PRN

## 2024-09-14 MED ORDER — FENTANYL CITRATE (PF) 100 MCG/2ML IJ SOLN: 25.0000 ug | INTRAMUSCULAR | Status: DC | PRN

## 2024-09-14 SURGICAL SUPPLY — 27 items
BAG COUNTER SPONGE SURGICOUNT (BAG) ×1 IMPLANT
BAG DRAIN URO-CYSTO SKYTR STRL (DRAIN) ×2 IMPLANT
BAG URINE DRAIN 2000ML AR STRL (UROLOGICAL SUPPLIES) ×1 IMPLANT
CATH FOLEY 2WAY SLVR 30CC 24FR (CATHETERS) ×1 IMPLANT
CATH FOLEY 3WAY 30CC 22FR (CATHETERS) ×1 IMPLANT
CATH URETL OPEN 5X70 (CATHETERS) IMPLANT
CATH URTH STD 24FR FL 3W 2 (CATHETERS) ×1 IMPLANT
ELECT REM PT RETURN 15FT ADLT (MISCELLANEOUS) ×1 IMPLANT
ELECTRODE REM PT RTRN 9FT ADLT (ELECTROSURGICAL) ×1 IMPLANT
EVACUATOR MICROVAS BLADDER (UROLOGICAL SUPPLIES) IMPLANT
GLOVE BIO SURGEON STRL SZ7.5 (GLOVE) ×1 IMPLANT
GOWN STRL REUS W/ TWL XL LVL3 (GOWN DISPOSABLE) ×3 IMPLANT
HOLDER FOLEY CATH W/STRAP (MISCELLANEOUS) IMPLANT
KIT TURNOVER KIT B (KITS) ×1 IMPLANT
LOOP CUT BIPOLAR 24F LRG (ELECTROSURGICAL) ×1 IMPLANT
MANIFOLD NEPTUNE II (INSTRUMENTS) ×2 IMPLANT
PACK CYSTO (CUSTOM PROCEDURE TRAY) ×1 IMPLANT
SET ASPIRATION TUBING (TUBING) ×1 IMPLANT
SET IRRIG Y TYPE TUR BLADDER L (SET/KITS/TRAYS/PACK) ×1 IMPLANT
SOL .9 NS 3000ML IRR UROMATIC (IV SOLUTION) ×4 IMPLANT
SOL PREP POV-IOD 4OZ 10% (MISCELLANEOUS) ×1 IMPLANT
SOLN 0.9% NACL POUR BTL 1000ML (IV SOLUTION) ×1 IMPLANT
SOLN STERILE WATER BTL 1000 ML (IV SOLUTION) ×1 IMPLANT
SYR 30ML LL (SYRINGE) IMPLANT
SYRINGE IRR TOOMEY STRL 70CC (SYRINGE) ×1 IMPLANT
TUBE CONNECTING 12X1/4 (SUCTIONS) IMPLANT
WATER STERILE IRR 3000ML UROMA (IV SOLUTION) IMPLANT

## 2024-09-14 NOTE — Transfer of Care (Signed)
 Immediate Anesthesia Transfer of Care Note  Patient: Nathaniel Watson  Procedure(s) Performed: TURBT, WITH CHEMOTHERAPEUTIC AGENT INSTILLATION INTO BLADDER (Bladder) CYSTOSCOPY, WITH RETROGRADE PYELOGRAM (Bladder)  Patient Location: PACU  Anesthesia Type:General  Level of Consciousness: awake, alert , and oriented  Airway & Oxygen Therapy: Patient Spontanous Breathing and Patient connected to face mask oxygen  Post-op Assessment: Report given to RN and Post -op Vital signs reviewed and stable  Post vital signs: Reviewed and stable  Last Vitals:  Vitals Value Taken Time  BP 113/84 09/14/24 15:06  Temp    Pulse 68 09/14/24 15:10  Resp 0 09/14/24 15:10  SpO2 92 % 09/14/24 15:10  Vitals shown include unfiled device data.  Last Pain: There were no vitals filed for this visit.       Complications: No notable events documented.

## 2024-09-14 NOTE — Anesthesia Procedure Notes (Signed)
 Procedure Name: LMA Insertion Date/Time: 09/14/2024 2:31 PM  Performed by: Ellawyn Wogan C, CRNAPre-anesthesia Checklist: Patient identified, Emergency Drugs available, Suction available and Patient being monitored Patient Re-evaluated:Patient Re-evaluated prior to induction Oxygen Delivery Method: Circle System Utilized Preoxygenation: Pre-oxygenation with 100% oxygen Induction Type: IV induction Ventilation: Mask ventilation without difficulty LMA: LMA inserted LMA Size: 4.0 Number of attempts: 1 Airway Equipment and Method: Bite block Placement Confirmation: positive ETCO2 Tube secured with: Tape Dental Injury: Teeth and Oropharynx as per pre-operative assessment

## 2024-09-14 NOTE — Op Note (Signed)
 Preoperative diagnosis:  Recurrent bladder cancer  Postoperative diagnosis:  Same, bladder tumor less than 1 cm  Procedure: Cystoscopy, transurethral resection of bladder tumor Bilateral retrograde pyelogram with interpretation Postoperative instillation of intravesical gemcitabine  Surgeon: Morene MICAEL Salines, MD  Anesthesia: General  Complications: None  Intraoperative findings:  #1: The patient had 2 small areas of recurrence at the right trigonal region and the right posterior bladder wall #2: The patient's right retrograde pyelogram demonstrated normal caliber ureter with no filling defects or other abnormalities. #3: The patient's left retrograde pyelogram demonstrated normal caliber ureter with no filling defects or other abnormalities.  EBL: Minimal  Specimens: bladder biopsy - right bladder neck/trigone and right sidewall around previous scar.  Indication: Nathaniel Watson is a 89 y.o. patient with high-grade nonmuscle invasive bladder cancer noted to have an area of recurrence on his most recent cystoscopic surveillance evaluation.  After reviewing the management options for treatment, he elected to proceed with the above surgical procedure(s). We have discussed the potential benefits and risks of the procedure, side effects of the proposed treatment, the likelihood of the patient achieving the goals of the procedure, and any potential problems that might occur during the procedure or recuperation. Informed consent has been obtained.  Description of procedure:  Consent was obtained the preoperative holding area.  He was brought back to the operating room placed on table in supine position.  General anesthesia was then induced endotracheal tube inserted.  He was placed in the dorsolithotomy position and prepped and draped in the routine sterile fashion.  A timeout was subsequently performed.  21 French degree cystoscope was gently passed through the patient's urethra into the  bladder under visual guidance.  A 5 French open-ended ureteral catheter was used to perform the patient's left retrograde pyelogram with 10 cc of Omnipaque  contrast noting the above findings.  Subsequently repeated the retrograde pyelogram on the patient's right side in a similar fashion with the above findings.  I then used a biopsy forceps to biopsy the area of abnormality or recurrence both at the former scar on the right trigonal region and the posterior wall.  I fulgurated this area area copiously.  I then placed an 25 French Foley catheter into the patient's bladder.  The patient was subsequently awoken and returned to PACU stable condition.  In the PACU, ~82mL's with 2gm of Gemcitabine was instilled into the patient's bladder through an 71 French Foley catheter.  This was allowed to dwell for 60-90 minutes prior to emptying the Foley catheter and removing it.

## 2024-09-14 NOTE — Interval H&P Note (Signed)
 History and Physical Interval Note:  09/14/2024 12:29 PM  Nathaniel Watson  has presented today for surgery, with the diagnosis of HISTORY OF BLADDER CANCER.  The various methods of treatment have been discussed with the patient and family. After consideration of risks, benefits and other options for treatment, the patient has consented to  Procedure(s) with comments: TURBT, WITH CHEMOTHERAPEUTIC AGENT INSTILLATION INTO BLADDER (N/A) - TURBT (TRANSURETHRAL RESECTION OF BLADDER TUMOR) WITH POST-OPERATIVE INSTILLATION OF INTRAVESICAL GEMCITABINE AND BILATERAL RETROGRADE PYELOGRAMS CYSTOSCOPY, WITH RETROGRADE PYELOGRAM (N/A) as a surgical intervention.  The patient's history has been reviewed, patient examined, no change in status, stable for surgery.  I have reviewed the patient's chart and labs.  Questions were answered to the patient's satisfaction.     Morene LELON Salines

## 2024-09-14 NOTE — Discharge Instructions (Addendum)
Transurethral Resection of Bladder Tumor (TURBT) or Bladder Biopsy   Definition:  Transurethral Resection of the Bladder Tumor is a surgical procedure used to diagnose and remove tumors within the bladder. TURBT is the most common treatment for early stage bladder cancer.  General instructions:     Your recent bladder surgery requires very little post hospital care but some definite precautions.  Despite the fact that no skin incisions were used, the area around the bladder incisions are raw and covered with scabs to promote healing and prevent bleeding. Certain precautions are needed to insure that the scabs are not disturbed over the next 2-4 weeks while the healing proceeds.  Because the raw surface inside your bladder and the irritating effects of urine you may expect frequency of urination and/or urgency (a stronger desire to urinate) and perhaps even getting up at night more often. This will usually resolve or improve slowly over the healing period. You may see some blood in your urine over the first 6 weeks. Do not be alarmed, even if the urine was clear for a while. Get off your feet and drink lots of fluids until clearing occurs. If you start to pass clots or don't improve call us.  Diet:  You may return to your normal diet immediately. Because of the raw surface of your bladder, alcohol, spicy foods, foods high in acid and drinks with caffeine may cause irritation or frequency and should be used in moderation. To keep your urine flowing freely and avoid constipation, drink plenty of fluids during the day (8-10 glasses). Tip: Avoid cranberry juice because it is very acidic.  Activity:  Your physical activity doesn't need to be restricted. However, if you are very active, you may see some blood in the urine. We suggest that you reduce your activity under the circumstances until the bleeding has stopped.  Bowels:  It is important to keep your bowels regular during the postoperative  period. Straining with bowel movements can cause bleeding. A bowel movement every other day is reasonable. Use a mild laxative if needed, such as milk of magnesia 2-3 tablespoons, or 2 Dulcolax tablets. Call if you continue to have problems. If you had been taking narcotics for pain, before, during or after your surgery, you may be constipated. Take a laxative if necessary.    Medication:  You should resume your pre-surgery medications unless told not to. In addition you may be given an antibiotic to prevent or treat infection. Antibiotics are not always necessary. All medication should be taken as prescribed until the bottles are finished unless you are having an unusual reaction to one of the drugs.   Post Anesthesia Home Care Instructions  Activity: Get plenty of rest for the remainder of the day. A responsible individual must stay with you for 24 hours following the procedure.  For the next 24 hours, DO NOT: -Drive a car -Operate machinery -Drink alcoholic beverages -Take any medication unless instructed by your physician -Make any legal decisions or sign important papers.  Meals: Start with liquid foods such as gelatin or soup. Progress to regular foods as tolerated. Avoid greasy, spicy, heavy foods. If nausea and/or vomiting occur, drink only clear liquids until the nausea and/or vomiting subsides. Call your physician if vomiting continues.  Special Instructions/Symptoms: Your throat may feel dry or sore from the anesthesia or the breathing tube placed in your throat during surgery. If this causes discomfort, gargle with warm salt water. The discomfort should disappear within 24 hours.       

## 2024-09-14 NOTE — Anesthesia Preprocedure Evaluation (Signed)
 Anesthesia Evaluation  Patient identified by MRN, date of birth, ID band Patient awake    Reviewed: Allergy & Precautions, NPO status , Patient's Chart, lab work & pertinent test results  Airway Mallampati: II  TM Distance: >3 FB Neck ROM: Full    Dental  (+) Teeth Intact, Dental Advisory Given   Pulmonary neg pulmonary ROS   Pulmonary exam normal breath sounds clear to auscultation       Cardiovascular hypertension, Pt. on medications Normal cardiovascular exam Rhythm:Regular Rate:Normal     Neuro/Psych negative neurological ROS     GI/Hepatic negative GI ROS, Neg liver ROS,,,  Endo/Other  negative endocrine ROS    Renal/GU Renal InsufficiencyRenal disease   HISTORY OF BLADDER CANCER    Musculoskeletal  (+) Arthritis ,    Abdominal   Peds  Hematology negative hematology ROS (+)   Anesthesia Other Findings Day of surgery medications reviewed with the patient.  Reproductive/Obstetrics                              Anesthesia Physical Anesthesia Plan  ASA: 3  Anesthesia Plan: General   Post-op Pain Management: Tylenol  PO (pre-op)*   Induction: Intravenous  PONV Risk Score and Plan: 3 and Dexamethasone and Ondansetron   Airway Management Planned: Oral ETT  Additional Equipment:   Intra-op Plan:   Post-operative Plan: Extubation in OR  Informed Consent: I have reviewed the patients History and Physical, chart, labs and discussed the procedure including the risks, benefits and alternatives for the proposed anesthesia with the patient or authorized representative who has indicated his/her understanding and acceptance.     Dental advisory given  Plan Discussed with: CRNA  Anesthesia Plan Comments:         Anesthesia Quick Evaluation

## 2024-09-17 ENCOUNTER — Encounter (HOSPITAL_COMMUNITY): Payer: Self-pay | Admitting: Urology

## 2024-09-17 LAB — SURGICAL PATHOLOGY

## 2024-09-17 NOTE — Anesthesia Postprocedure Evaluation (Signed)
 Anesthesia Post Note  Patient: Nathaniel Watson  Procedure(s) Performed: TURBT, WITH CHEMOTHERAPEUTIC AGENT INSTILLATION INTO BLADDER (Bladder) CYSTOSCOPY, WITH RETROGRADE PYELOGRAM (Bladder)     Patient location during evaluation: PACU Anesthesia Type: General Level of consciousness: awake and alert Pain management: pain level controlled Vital Signs Assessment: post-procedure vital signs reviewed and stable Respiratory status: spontaneous breathing, nonlabored ventilation and respiratory function stable Cardiovascular status: blood pressure returned to baseline and stable Postop Assessment: no apparent nausea or vomiting Anesthetic complications: no   No notable events documented.  Last Vitals:  Vitals:   09/14/24 1615 09/14/24 1630  BP: (!) 140/80   Pulse: (!) 59 80  Resp: 17   Temp:    SpO2: 93% 95%    Last Pain:  Vitals:   09/14/24 1730  PainSc: 0-No pain                 Garnette FORBES Skillern
